# Patient Record
Sex: Male | Born: 1980 | Race: White | Hispanic: No | Marital: Married | Smoking: Current some day smoker
Health system: Southern US, Community
[De-identification: ages and names within clinical notes are randomized; demographics above are authoritative.]

## PROBLEM LIST (undated history)

## (undated) DIAGNOSIS — I639 Cerebral infarction, unspecified: Secondary | ICD-10-CM

## (undated) DIAGNOSIS — F329 Major depressive disorder, single episode, unspecified: Secondary | ICD-10-CM

## (undated) DIAGNOSIS — F419 Anxiety disorder, unspecified: Secondary | ICD-10-CM

## (undated) DIAGNOSIS — F32A Depression, unspecified: Secondary | ICD-10-CM

---

## 2010-10-22 ENCOUNTER — Emergency Department (HOSPITAL_COMMUNITY)
Admission: EM | Admit: 2010-10-22 | Discharge: 2010-10-22 | Disposition: A | Discharge status: Home / Self Care | Payer: 59 | Attending: Emergency Medicine | Admitting: Emergency Medicine

## 2010-10-22 DIAGNOSIS — F17200 Nicotine dependence, unspecified, uncomplicated: Secondary | ICD-10-CM | POA: Insufficient documentation

## 2010-10-22 DIAGNOSIS — S76219A Strain of adductor muscle, fascia and tendon of unspecified thigh, initial encounter: Secondary | ICD-10-CM

## 2010-10-22 DIAGNOSIS — X58XXXA Exposure to other specified factors, initial encounter: Secondary | ICD-10-CM | POA: Insufficient documentation

## 2010-10-22 DIAGNOSIS — IMO0002 Reserved for concepts with insufficient information to code with codable children: Secondary | ICD-10-CM | POA: Insufficient documentation

## 2010-10-22 DIAGNOSIS — M79609 Pain in unspecified limb: Secondary | ICD-10-CM | POA: Insufficient documentation

## 2010-10-22 HISTORY — DX: Major depressive disorder, single episode, unspecified: F32.9

## 2010-10-22 HISTORY — DX: Depression, unspecified: F32.A

## 2010-10-22 HISTORY — DX: Anxiety disorder, unspecified: F41.9

## 2010-10-22 MED ORDER — IBUPROFEN 800 MG PO TABS
800.0000 mg | ORAL_TABLET | Freq: Once | ORAL | Status: AC
  Administered 2010-10-22: 800 mg via ORAL
  Filled 2010-10-22: qty 1

## 2010-10-22 MED ORDER — METHOCARBAMOL 500 MG PO TABS: ORAL_TABLET | ORAL | Status: DC

## 2010-10-22 MED ORDER — IBUPROFEN 800 MG PO TABS: 800.0000 mg | ORAL_TABLET | Freq: Three times a day (TID) | ORAL | Status: AC

## 2010-10-22 MED ORDER — HYDROCODONE-ACETAMINOPHEN 5-325 MG PO TABS: ORAL_TABLET | ORAL | Status: DC

## 2010-10-22 MED ORDER — HYDROCODONE-ACETAMINOPHEN 5-325 MG PO TABS
2.0000 | ORAL_TABLET | Freq: Once | ORAL | Status: AC
  Administered 2010-10-22: 2 via ORAL
  Filled 2010-10-22: qty 2

## 2010-10-22 MED ORDER — METHOCARBAMOL 500 MG PO TABS
1000.0000 mg | ORAL_TABLET | Freq: Once | ORAL | Status: AC
  Administered 2010-10-22: 1000 mg via ORAL
  Filled 2010-10-22: qty 2

## 2010-10-22 NOTE — ED Notes (Signed)
Pt states upper lt leg hurts, denies injury trauma or a fall. Pt states has progressively gotten worse over past few  Days.

## 2010-10-22 NOTE — ED Provider Notes (Signed)
History     Chief Complaint  Patient presents with  . Leg Pain   Patient is a 30 y.o. male presenting with leg pain. The history is provided by the patient.  Leg Pain  The incident occurred yesterday. The incident occurred at work. The injury mechanism is unknown. Pain location: Left groin. The quality of the pain is described as aching. The pain is moderate. The pain has been worsening since onset. Associated symptoms include inability to bear weight. The symptoms are aggravated by bearing weight and palpation. He has tried nothing for the symptoms.    Past Medical History  Diagnosis Date  . Depression   . Anxiety     History reviewed. No pertinent past surgical history.  History reviewed. No pertinent family history.  History  Substance Use Topics  . Smoking status: Current Some Day Smoker  . Smokeless tobacco: Not on file  . Alcohol Use: Yes      Review of Systems  Constitutional: Negative for activity change.       All ROS Neg except as noted in HPI  HENT: Negative for nosebleeds and neck pain.   Eyes: Negative for photophobia and discharge.  Respiratory: Negative for cough, shortness of breath and wheezing.   Cardiovascular: Negative for chest pain and palpitations.  Gastrointestinal: Negative for abdominal pain and blood in stool.  Genitourinary: Negative for dysuria, frequency and hematuria.  Musculoskeletal: Negative for back pain and arthralgias.  Skin: Negative.   Neurological: Negative for dizziness, seizures and speech difficulty.  Psychiatric/Behavioral: Negative for hallucinations and confusion.    Physical Exam  BP 121/80  Pulse 79  Temp(Src) 97.4 F (36.3 C) (Oral)  Resp 16  Ht 5\' 11"  (1.803 m)  Wt 210 lb (95.255 kg)  BMI 29.29 kg/m2  SpO2 100%  Physical Exam  Nursing note and vitals reviewed. Constitutional: He is oriented to person, place, and time. He appears well-developed and well-nourished.  Non-toxic appearance.  HENT:  Head:  Normocephalic.  Right Ear: Tympanic membrane and external ear normal.  Left Ear: Tympanic membrane and external ear normal.  Eyes: EOM and lids are normal. Pupils are equal, round, and reactive to light.  Neck: Normal range of motion. Neck supple. Carotid bruit is not present.  Cardiovascular: Normal rate, regular rhythm, normal heart sounds, intact distal pulses and normal pulses.   Pulmonary/Chest: Breath sounds normal. No respiratory distress.  Abdominal: Soft. Bowel sounds are normal. There is no tenderness. There is no guarding.       No evidence of hernia.  Musculoskeletal: Normal range of motion.       Pain to the left inner thigh and groin with attempted ROM. No deformity or dislocation.  Lymphadenopathy:       Head (right side): No submandibular adenopathy present.       Head (left side): No submandibular adenopathy present.    He has no cervical adenopathy.  Neurological: He is alert and oriented to person, place, and time. He has normal strength. No cranial nerve deficit or sensory deficit.  Skin: Skin is warm and dry.  Psychiatric: He has a normal mood and affect. His speech is normal.    ED Course  Procedures  MDM I have reviewed nursing notes, vital signs, and all appropriate lab and imaging results for this patient.      Kathie Dike, [place] 10/22/10 [phone]

## 2010-10-22 NOTE — ED Notes (Signed)
Pt presents with left quad muscle pain. Pt states pain started yesterday after standing for 8 hours. Pt worked today and pain worsened to 10/10. Pt limping to triage. Pt denies groin pain.

## 2010-10-23 NOTE — ED Provider Notes (Signed)
Medical screening examination/treatment/procedure(s) were performed by non-physician practitioner and as supervising physician I was immediately available for consultation/collaboration.  Flint Melter, MD 10/23/10 [phone]

## 2018-09-13 ENCOUNTER — Emergency Department: Payer: Managed Care, Other (non HMO)

## 2018-09-13 ENCOUNTER — Inpatient Hospital Stay
Admission: EM | Admit: 2018-09-13 | Discharge: 2018-09-14 | DRG: 063 | Disposition: A | Discharge status: Home / Self Care | Payer: Managed Care, Other (non HMO) | Attending: Nurse Practitioner | Admitting: Nurse Practitioner

## 2018-09-13 ENCOUNTER — Inpatient Hospital Stay (HOSPITAL_COMMUNITY)
Admit: 2018-09-13 | Discharge: 2018-09-13 | Disposition: A | Discharge status: Home / Self Care | Payer: Managed Care, Other (non HMO) | Attending: Nurse Practitioner | Admitting: Nurse Practitioner

## 2018-09-13 ENCOUNTER — Inpatient Hospital Stay: Payer: Managed Care, Other (non HMO)

## 2018-09-13 ENCOUNTER — Other Ambulatory Visit: Payer: Self-pay

## 2018-09-13 ENCOUNTER — Inpatient Hospital Stay: Admit: 2018-09-13 | Payer: Managed Care, Other (non HMO)

## 2018-09-13 DIAGNOSIS — Z91018 Allergy to other foods: Secondary | ICD-10-CM | POA: Diagnosis not present

## 2018-09-13 DIAGNOSIS — Z91120 Patient's intentional underdosing of medication regimen due to financial hardship: Secondary | ICD-10-CM | POA: Diagnosis not present

## 2018-09-13 DIAGNOSIS — E785 Hyperlipidemia, unspecified: Secondary | ICD-10-CM | POA: Diagnosis present

## 2018-09-13 DIAGNOSIS — Y92009 Unspecified place in unspecified non-institutional (private) residence as the place of occurrence of the external cause: Secondary | ICD-10-CM

## 2018-09-13 DIAGNOSIS — F319 Bipolar disorder, unspecified: Secondary | ICD-10-CM | POA: Diagnosis present

## 2018-09-13 DIAGNOSIS — Z20828 Contact with and (suspected) exposure to other viral communicable diseases: Secondary | ICD-10-CM | POA: Diagnosis present

## 2018-09-13 DIAGNOSIS — Z9114 Patient's other noncompliance with medication regimen: Secondary | ICD-10-CM

## 2018-09-13 DIAGNOSIS — F17200 Nicotine dependence, unspecified, uncomplicated: Secondary | ICD-10-CM | POA: Diagnosis present

## 2018-09-13 DIAGNOSIS — I639 Cerebral infarction, unspecified: Secondary | ICD-10-CM | POA: Insufficient documentation

## 2018-09-13 DIAGNOSIS — T426X6A Underdosing of other antiepileptic and sedative-hypnotic drugs, initial encounter: Secondary | ICD-10-CM | POA: Diagnosis present

## 2018-09-13 DIAGNOSIS — G459 Transient cerebral ischemic attack, unspecified: Secondary | ICD-10-CM | POA: Diagnosis present

## 2018-09-13 DIAGNOSIS — F3170 Bipolar disorder, currently in remission, most recent episode unspecified: Secondary | ICD-10-CM | POA: Diagnosis not present

## 2018-09-13 LAB — URINALYSIS, ROUTINE W REFLEX MICROSCOPIC
Bacteria, UA: NONE SEEN
Bilirubin Urine: NEGATIVE
Glucose, UA: NEGATIVE mg/dL
Ketones, ur: NEGATIVE mg/dL
Leukocytes,Ua: NEGATIVE
Nitrite: NEGATIVE
Protein, ur: NEGATIVE mg/dL
Specific Gravity, Urine: >1.046 — ABNORMAL HIGH (ref 1.005–1.030)
Squamous Epithelial / LPF: NONE SEEN (ref 0–5)
WBC, UA: NONE SEEN WBC/hpf (ref 0–5)
pH: 6 (ref 5.0–8.0)

## 2018-09-13 LAB — CBC
HCT: 42.1 % (ref 39.0–52.0)
Hemoglobin: 14.1 g/dL (ref 13.0–17.0)
MCH: 30.3 pg (ref 26.0–34.0)
MCHC: 33.5 g/dL (ref 30.0–36.0)
MCV: 90.5 fL (ref 80.0–100.0)
Platelets: 297 10*3/uL (ref 150–400)
RBC: 4.65 MIL/uL (ref 4.22–5.81)
RDW: 13 % (ref 11.5–15.5)
WBC: 8.8 10*3/uL (ref 4.0–10.5)
nRBC: 0 % (ref 0.0–0.2)

## 2018-09-13 LAB — COMPREHENSIVE METABOLIC PANEL
ALT: 23 U/L (ref 0–44)
AST: 21 U/L (ref 15–41)
Albumin: 4 g/dL (ref 3.5–5.0)
Alkaline Phosphatase: 53 U/L (ref 38–126)
Anion gap: 10 (ref 5–15)
BUN: 12 mg/dL (ref 6–20)
CO2: 24 mmol/L (ref 22–32)
Calcium: 8.6 mg/dL — ABNORMAL LOW (ref 8.9–10.3)
Chloride: 101 mmol/L (ref 98–111)
Creatinine, Ser: 1.07 mg/dL (ref 0.61–1.24)
GFR calc Af Amer: >60 mL/min (ref >60)
GFR calc non Af Amer: >60 mL/min (ref >60)
Glucose, Bld: 119 mg/dL — ABNORMAL HIGH (ref 70–99)
Potassium: 3.6 mmol/L (ref 3.5–5.1)
Sodium: 135 mmol/L (ref 135–145)
Total Bilirubin: 0.4 mg/dL (ref 0.3–1.2)
Total Protein: 7.4 g/dL (ref 6.5–8.1)

## 2018-09-13 LAB — DIFFERENTIAL
Abs Immature Granulocytes: 0.04 10*3/uL (ref 0.00–0.07)
Basophils Absolute: 0 10*3/uL (ref 0.0–0.1)
Basophils Relative: 1 %
Eosinophils Absolute: 0.1 10*3/uL (ref 0.0–0.5)
Eosinophils Relative: 1 %
Immature Granulocytes: 1 %
Lymphocytes Relative: 34 %
Lymphs Abs: 3 10*3/uL (ref 0.7–4.0)
Monocytes Absolute: 0.8 10*3/uL (ref 0.1–1.0)
Monocytes Relative: 9 %
Neutro Abs: 4.8 10*3/uL (ref 1.7–7.7)
Neutrophils Relative %: 54 %

## 2018-09-13 LAB — URINE DRUG SCREEN, QUALITATIVE (ARMC ONLY)
Amphetamines, Ur Screen: NOT DETECTED
Barbiturates, Ur Screen: NOT DETECTED
Benzodiazepine, Ur Scrn: NOT DETECTED
Cannabinoid 50 Ng, Ur ~~LOC~~: NOT DETECTED
Cocaine Metabolite,Ur ~~LOC~~: NOT DETECTED
MDMA (Ecstasy)Ur Screen: NOT DETECTED
Methadone Scn, Ur: NOT DETECTED
Opiate, Ur Screen: NOT DETECTED
Phencyclidine (PCP) Ur S: NOT DETECTED
Tricyclic, Ur Screen: NOT DETECTED

## 2018-09-13 LAB — APTT: aPTT: 31 seconds (ref 24–36)

## 2018-09-13 LAB — LIPID PANEL
Cholesterol: 203 mg/dL — ABNORMAL HIGH (ref 0–200)
HDL: 30 mg/dL — ABNORMAL LOW (ref >40)
LDL Cholesterol: 128 mg/dL — ABNORMAL HIGH (ref 0–99)
Total CHOL/HDL Ratio: 6.8 RATIO
Triglycerides: 224 mg/dL — ABNORMAL HIGH (ref <150)
VLDL: 45 mg/dL — ABNORMAL HIGH (ref 0–40)

## 2018-09-13 LAB — SARS CORONAVIRUS 2 BY RT PCR (HOSPITAL ORDER, PERFORMED IN ~~LOC~~ HOSPITAL LAB): SARS Coronavirus 2: NEGATIVE

## 2018-09-13 LAB — MRSA PCR SCREENING: MRSA by PCR: NEGATIVE

## 2018-09-13 LAB — PROTIME-INR
INR: 1 (ref 0.8–1.2)
Prothrombin Time: 13.1 seconds (ref 11.4–15.2)

## 2018-09-13 LAB — GLUCOSE, CAPILLARY: Glucose-Capillary: 111 mg/dL — ABNORMAL HIGH (ref 70–99)

## 2018-09-13 LAB — ETHANOL: Alcohol, Ethyl (B): <10 mg/dL (ref <10)

## 2018-09-13 LAB — PROCALCITONIN: Procalcitonin: <0.1 ng/mL

## 2018-09-13 MED ORDER — ALTEPLASE (STROKE) FULL DOSE INFUSION
9.0000 mg | Freq: Once | INTRAVENOUS | Status: AC
  Administered 2018-09-13: 9 mg via INTRAVENOUS
  Filled 2018-09-13: qty 100

## 2018-09-13 MED ORDER — ACETAMINOPHEN 325 MG PO TABS: 650.0000 mg | ORAL_TABLET | ORAL | Status: DC | PRN

## 2018-09-13 MED ORDER — SENNOSIDES-DOCUSATE SODIUM 8.6-50 MG PO TABS
1.0000 | ORAL_TABLET | Freq: Every evening | ORAL | Status: DC | PRN
  Filled 2018-09-13: qty 1

## 2018-09-13 MED ORDER — SODIUM CHLORIDE 0.9 % IV SOLN
INTRAVENOUS | Status: DC
  Administered 2018-09-13: 75 mL/h via INTRAVENOUS

## 2018-09-13 MED ORDER — ACETAMINOPHEN 650 MG RE SUPP
650.0000 mg | RECTAL | Status: DC | PRN
  Filled 2018-09-13: qty 1

## 2018-09-13 MED ORDER — SODIUM CHLORIDE 0.9 % IV SOLN
50.0000 mL | Freq: Once | INTRAVENOUS | Status: AC
  Administered 2018-09-13: 50 mL via INTRAVENOUS

## 2018-09-13 MED ORDER — ALTEPLASE (STROKE) FULL DOSE INFUSION
81.0000 mg | Freq: Once | INTRAVENOUS | Status: AC
  Administered 2018-09-13: 81 mg via INTRAVENOUS
  Filled 2018-09-13: qty 100

## 2018-09-13 MED ORDER — STROKE: EARLY STAGES OF RECOVERY BOOK: Freq: Once | Status: DC

## 2018-09-13 MED ORDER — ACETAMINOPHEN 160 MG/5ML PO SOLN
650.0000 mg | ORAL | Status: DC | PRN
  Filled 2018-09-13: qty 20.3

## 2018-09-13 MED ORDER — IOPAMIDOL (ISOVUE-370) INJECTION 76%
100.0000 mL | Freq: Once | INTRAVENOUS | Status: AC | PRN
  Administered 2018-09-13: 100 mL via INTRAVENOUS

## 2018-09-13 MED ORDER — ALTEPLASE 100 MG IV SOLR
INTRAVENOUS | Status: AC
  Filled 2018-09-13: qty 100

## 2018-09-13 NOTE — ED Triage Notes (Signed)
Patient reports numbness to left side of face and arm for approximately 1 hour.

## 2018-09-13 NOTE — ED Provider Notes (Signed)
[facility] Emergency Department Provider Note  ____________________________________________   First MD Initiated Contact with Patient 09/13/18 [phone]     (approximate)  I have reviewed the triage vital signs and the nursing notes.   HISTORY  Chief Complaint Numbness    HPI Anthony Crawford is a 38 y.o. male density emergency department with a 1 hour history of left side facial numbness and left arm weakness.  Patient states that he was awake at the time of onset patient denies any visual difficulty.  Patient denies any lower extremity involvement.  Patient denies any nausea or vomiting.  Patient denies any headache.  Patient also admits to 1 day history of cough and dyspnea.        Past Medical History:  Diagnosis Date   Anxiety    Depression     There are no active problems to display for this patient.   No past surgical history on file.  Prior to Admission medications   Medication Sig Start Date End Date Taking? Authorizing Provider  HYDROcodone-acetaminophen (NORCO) 5-325 MG per tablet 1-2 po q4h prn pain. Patient not taking: Reported on 09/13/2018 10/22/10   Ivery QualeBryant, Hobson, PA-C  ibuprofen (ADVIL,MOTRIN) 200 MG tablet Take 200 mg by mouth every 6 (six) hours as needed. Pain     [provider]  methocarbamol (ROBAXIN) 500 MG tablet 2 po tid for spasm Patient not taking: Reported on 09/13/2018 10/22/10   Ivery QualeBryant, Hobson, PA-C    Allergies Other, Strawberry extract, and Tequila sunrise flavor  No family history on file.  Social History Social History   Tobacco Use   Smoking status: Current Some Day Smoker  Substance Use Topics   Alcohol use: Yes   Drug use: No    Review of Systems Constitutional: No fever/chills Eyes: No visual changes. ENT: No sore throat. Cardiovascular: Denies chest pain. Respiratory: Denies shortness of breath. Gastrointestinal: No abdominal pain.  No nausea, no vomiting.  No diarrhea.  No  constipation. Genitourinary: Negative for dysuria. Musculoskeletal: Negative for neck pain.  Negative for back pain. Integumentary: Negative for rash. Neurological:positive for left facial numbness and left arm weakness  ____________________________________________   PHYSICAL EXAM:  VITAL SIGNS: ED Triage Vitals [09/13/18 0439]  Enc Vitals Group     BP      Pulse      Resp      Temp      Temp src      SpO2      Weight 102.1 kg (225 lb)     Height 1.803 m (5\' 11" )     Head Circumference      Peak Flow      Pain Score 6     Pain Loc      Pain Edu?      Excl. in GC?     Constitutional: Alert and oriented. Well appearing and in no acute distress. Eyes: Conjunctivae are normal. PERRL. EOMI. Mouth/Throat: Mucous membranes are moist.  Oropharynx non-erythematous. Neck: No stridor.  Cardiovascular: Normal rate, regular rhythm. Good peripheral circulation. Grossly normal heart sounds. Respiratory: Normal respiratory effort.  No retractions. No audible wheezing. Gastrointestinal: Soft and nontender. No distention.  Musculoskeletal: No lower extremity tenderness nor edema. No gross deformities of extremities. Neurologic:  Normal speech and language.  Left arm drift (however not down to the bed ).  Left lower extremity drift (does not hit the bed). After CT scan patient unable to lift left leg off the bed patient's left arm also  difficult to raise off the bed at this time.  skin:  Skin is warm, dry and intact. No rash noted. Psychiatric: Mood and affect are normal. Speech and behavior are normal.  ____________________________________________   LABS (all labs ordered are listed, but only abnormal results are displayed)  Labs Reviewed  COMPREHENSIVE METABOLIC PANEL - Abnormal; Notable for the following components:      Result Value   Glucose, Bld 119 (*)    Calcium 8.6 (*)    All other components within normal limits  GLUCOSE, CAPILLARY - Abnormal; Notable for the following  components:   Glucose-Capillary 111 (*)    All other components within normal limits  SARS CORONAVIRUS 2 (HOSPITAL ORDER, PERFORMED IN [facility])  PROTIME-INR  APTT  CBC  DIFFERENTIAL  ETHANOL  URINALYSIS, ROUTINE W REFLEX MICROSCOPIC  URINE DRUG SCREEN, QUALITATIVE ([facility] ONLY)  CBG MONITORING, ED  I-STAT CREATININE, ED  I-STAT CREATININE, ED   ___________________________________________  RADIOLOGY I, Elber[place] N Tenleigh Byer, personally viewed and evaluated these images (plain radiographs) as part of my medical decision making, as well as reviewing the written report by the radiologist.  ED MD interpretation: Negative noncontrast CT scan of the head.  CT angiogram of the head negative as well as CT of the neck  Official radiology report(s): Ct Code Stroke Cta Head W/wo Contrast  Result Date: 09/13/2018 CLINICAL DATA:  38 y/o  M; progressive left-sided weakness. EXAM: CT ANGIOGRAPHY HEAD AND NECK TECHNIQUE: Multidetector CT imaging of the head and neck was performed using the standard protocol during bolus administration of intravenous contrast. Multiplanar CT image reconstructions and MIPs were obtained to evaluate the vascular anatomy. Carotid stenosis measurements (when applicable) are obtained utilizing NASCET criteria, using the distal internal carotid diameter as the denominator. CONTRAST:  100mL ISOVUE-370 IOPAMIDOL (ISOVUE-370) INJECTION 76% COMPARISON:  None. FINDINGS: CT HEAD FINDINGS Brain: No evidence of acute infarction, hemorrhage, hydrocephalus, extra-axial collection or mass lesion/mass effect. ASPECTS is 10. Vascular: As below. Skull: Normal. Negative for fracture or focal lesion. Sinuses: Imaged portions are clear. Orbits: No acute finding. Review of the MIP images confirms the above findings CTA NECK FINDINGS Aortic arch: Standard branching. Imaged portion shows no evidence of aneurysm or dissection. No significant stenosis of the major arch vessel origins. Right  carotid system: No evidence of dissection, stenosis (50% or greater) or occlusion. Left carotid system: No evidence of dissection, stenosis (50% or greater) or occlusion. Vertebral arteries: Codominant. No evidence of dissection, stenosis (50% or greater) or occlusion. Skeleton: Negative. Other neck: Negative. Upper chest: Negative. Review of the MIP images confirms the above findings CTA HEAD FINDINGS Anterior circulation: No significant stenosis, proximal occlusion, aneurysm, or vascular malformation. Posterior circulation: No significant stenosis, proximal occlusion, aneurysm, or vascular malformation. Venous sinuses: As permitted by contrast timing, patent. Anatomic variants: None significant. Review of the MIP images confirms the above findings IMPRESSION: 1. Negative noncontrast CT of the head.  ASPECTS is 10. 2. Patent carotid and vertebral arteries. No dissection, aneurysm, or hemodynamically significant stenosis utilizing NASCET criteria. 3. Patent anterior and posterior intracranial circulation. No large vessel occlusion, aneurysm, or significant stenosis. Noncontrast CT head results were called by telephone at the time of interpretation on 09/13/2018 at 5:24 am to tele neurology nurse who verbally acknowledged these results. CT angiogram results were called by telephone at the time of interpretation on 09/13/2018 at 5:29 am to tele neurology nurse, who verbally acknowledged these results. Electronically Signed   By: Buzzy HanLance  Furusawa-Stratton M.D.  On: 09/13/2018 05:32   Ct Code Stroke Cta Neck W/wo Contrast  Result Date: 09/13/2018 CLINICAL DATA:  38 y/o  M; progressive left-sided weakness. EXAM: CT ANGIOGRAPHY HEAD AND NECK TECHNIQUE: Multidetector CT imaging of the head and neck was performed using the standard protocol during bolus administration of intravenous contrast. Multiplanar CT image reconstructions and MIPs were obtained to evaluate the vascular anatomy. Carotid stenosis measurements (when  applicable) are obtained utilizing NASCET criteria, using the distal internal carotid diameter as the denominator. CONTRAST:  100mL ISOVUE-370 IOPAMIDOL (ISOVUE-370) INJECTION 76% COMPARISON:  None. FINDINGS: CT HEAD FINDINGS Brain: No evidence of acute infarction, hemorrhage, hydrocephalus, extra-axial collection or mass lesion/mass effect. ASPECTS is 10. Vascular: As below. Skull: Normal. Negative for fracture or focal lesion. Sinuses: Imaged portions are clear. Orbits: No acute finding. Review of the MIP images confirms the above findings CTA NECK FINDINGS Aortic arch: Standard branching. Imaged portion shows no evidence of aneurysm or dissection. No significant stenosis of the major arch vessel origins. Right carotid system: No evidence of dissection, stenosis (50% or greater) or occlusion. Left carotid system: No evidence of dissection, stenosis (50% or greater) or occlusion. Vertebral arteries: Codominant. No evidence of dissection, stenosis (50% or greater) or occlusion. Skeleton: Negative. Other neck: Negative. Upper chest: Negative. Review of the MIP images confirms the above findings CTA HEAD FINDINGS Anterior circulation: No significant stenosis, proximal occlusion, aneurysm, or vascular malformation. Posterior circulation: No significant stenosis, proximal occlusion, aneurysm, or vascular malformation. Venous sinuses: As permitted by contrast timing, patent. Anatomic variants: None significant. Review of the MIP images confirms the above findings IMPRESSION: 1. Negative noncontrast CT of the head.  ASPECTS is 10. 2. Patent carotid and vertebral arteries. No dissection, aneurysm, or hemodynamically significant stenosis utilizing NASCET criteria. 3. Patent anterior and posterior intracranial circulation. No large vessel occlusion, aneurysm, or significant stenosis. Noncontrast CT head results were called by telephone at the time of interpretation on 09/13/2018 at 5:24 am to tele neurology nurse who verbally  acknowledged these results. CT angiogram results were called by telephone at the time of interpretation on 09/13/2018 at 5:29 am to tele neurology nurse, who verbally acknowledged these results. Electronically Signed   By: Mitzi HansenLance  Furusawa-Stratton M.D.   On: 09/13/2018 05:32    ____________________________________________   PROCEDURES   Procedure(s) performed (including Critical Care):  .Critical Care Performed by: Darci CurrentBrown, [place] N, MD Authorized by: Darci CurrentBrown, Heard N, MD   Critical care provider statement:    Critical care time (minutes):  60   Critical care time was exclusive of:  Separately billable procedures and treating other patients (Cerebrovascular accident)   Critical care was time spent personally by me on the following activities:  Development of treatment plan with patient or surrogate, discussions with consultants, evaluation of patient's response to treatment, examination of patient, obtaining history from patient or surrogate, ordering and performing treatments and interventions, ordering and review of laboratory studies, ordering and review of radiographic studies, pulse oximetry, re-evaluation of patient's condition and review of old charts     ____________________________________________   INITIAL IMPRESSION / MDM / ASSESSMENT AND PLAN / ED COURSE  As part of my medical decision making, I reviewed the following data within the electronic MEDICAL RECORD NUMBER  38 year old male presented with above-stated history and physical exam concerning for CVA and as such code stroke was initiated.  Patient went for CT scan of the head immediately after my evaluation.  Patient discussed with Dr. Glenetta HewMier neurologist who recommends TPA at this time  which he ordered.  6:45 AM On reevaluation patient symptoms completely resolved patient able to raise both his left arm and leg off the bed and hold for greater than 10 seconds without any drift..   Patient's COVID-19 testing negative.   Patient discussed with Dr. Posey Pronto hospitalist for hospital admission for further evaluation and management of CVA.  *Daris Harkins was evaluated in Emergency Department on 09/13/2018 for the symptoms described in the history of present illness. He was evaluated in the context of the global COVID-19 pandemic, which necessitated consideration that the patient might be at risk for infection with the SARS-CoV-2 virus that causes COVID-19. Institutional protocols and algorithms that pertain to the evaluation of patients at risk for COVID-19 are in a state of rapid change based on information released by regulatory bodies including the CDC and federal and state organizations. These policies and algorithms were followed during the patient's care in the ED.  Some ED evaluations and interventions may be delayed as a result of limited staffing during the pandemic.*   ____________________________________________  FINAL CLINICAL IMPRESSION(S) / ED DIAGNOSES  Final diagnoses:  Cerebrovascular accident (CVA), unspecified mechanism ([place])     MEDICATIONS GIVEN DURING THIS VISIT:  Medications  iopamidol (ISOVUE-370) 76 % injection 100 mL (100 mLs Intravenous Contrast Given 09/13/18 0517)  alteplase (ACTIVASE) 1 mg/mL infusion 9 mg (0 mg Intravenous Stopped 09/13/18 0529)  alteplase (ACTIVASE) 1 mg/mL infusion 81 mg (0 mg Intravenous Stopped 09/13/18 0630)    Followed by  0.9 %  sodium chloride infusion (0 mLs Intravenous Stopped 09/13/18 7209)     ED Discharge Orders    None       Note:  This document was prepared using Dragon voice recognition software and may include unintentional dictation errors.   Gregor Hams, MD 09/13/18 [phone]

## 2018-09-13 NOTE — Progress Notes (Signed)
OT Cancellation Note  Patient Details Name: Anthony Crawford MRN: [id number] DOB: [birth year]   Cancelled Treatment:    Reason Eval/Treat Not Completed: Patient not medically ready OT order received, pt chart reviewed. Per chart review tPA administered. Per OT guidelines, hold evaluation until 24hrs post tPA administration and follow up imaging performed.  Zenovia Jarred, [place], OTR/L [place]  Zenovia Jarred 09/13/2018, 3:30 PM

## 2018-09-13 NOTE — Consult Note (Signed)
19 M smoker with PMH of anxiety and depression only adm to ICU/SDU after tPA for suspected acute CVA.  PCCM service is available as needed for any issues that arise while in ICU/SDU  Merton Border, MD PCCM service Mobile [phone] Pager [phone] 09/13/2018 2:54 PM

## 2018-09-13 NOTE — ED Notes (Signed)
Pt arrived in room from ct.

## 2018-09-13 NOTE — ED Notes (Signed)
Patient eating food brought by wife.

## 2018-09-13 NOTE — ED Notes (Signed)
Report to amber, rn.  

## 2018-09-13 NOTE — ED Notes (Signed)
Dr. Mike Craze with teleneurology on screen at this time. Pt is currently in ct.

## 2018-09-13 NOTE — ED Notes (Signed)
Patient back from MRI.

## 2018-09-13 NOTE — Consult Note (Signed)
Chief Complaint: left sided numbness (face arm and leg)   LSN: 4:00 am tPA Given: Yes This iS 38 y.o. male with pertinent past medical history of hyperlipidemia and bipolar disorder on lamotrigine ( has not been taking it, he can;t afford psych/meds) temporal arteritis  presenting to the ED with chief complaints of left-sided numbness and weakness.   onset of symptoms on 09/13/2018 at about 03:30 - 04:00 in am with  with worsening left facial and left arm left leg numbness and weakness while taking a shower.  Denies associated altered sensorium, speech abnormality, cranial nerve deficit, seizures, diplopia, nausea or vomiting, headachesyncope or LOC, paresthesia (numbness, tingling, pins-and-needles sensation) or a heavy feeling in an extremity.  Patient states he has not been feeling well since Monday, was evaluated at urgent care for shortness of breath, cough, fever and generalized weakness prior to onset of symptoms.  Due to worsening symptoms patient decided to present to the ED for further evaluation of a possible stroke. On arrival to the ED, he was afebrile with blood pressure 127/81 mm Hg and pulse rate 84 beats/min.  He was noted to have left facial droop, left facial and arm numbness and weakness.  Initial NIH stroke scale was 5. Code stroke was initiated and patient was evaluated by a tele-neurologist.  CTA head and neck was obtained which showed no large vessel occlusion or acute intracranial abnormality.  Patient was deemed a candidate for IV alteplase. Patient tolerated tPA without complication.  On exam, there were no focal neurologic deficit, patient reported resolution of symptoms with mild residual weakness in his left lower leg. Patient reported he was not on any antiplatelet or anticoagulation prior to this episode.  Initial labs in the ED including COVID-19 unremarkable.  Chest x-ray shows no active cardiopulmonary process.  Patient is being admitted under hospitalist service in  the ICU for further  Patient have been having HA since Monday that he describes being dull that are getting better. Patient has history of migraine in his 20's that subsided around his mid 79's. Holocephalic, not specific trigger, lasting for 2-3 days, relieved bi OTC meds with photophobia, phonophobia. Last One was one year and half ago    Past Medical History:  Diagnosis Date  . Anxiety   . Depression     No past surgical history on file.  Family History  Problem Relation Age of Onset  . Lupus Mother   . Diabetes Father    Social History:  reports that he has been smoking. He does not have any smokeless tobacco history on file. He reports current alcohol use. He reports that he does not use drugs.  Allergies:  Allergies  Allergen Reactions  . Other Other (See Comments)    Cactus, Patient states that he will die if he touches it.   . Strawberry Extract Itching  . [employer] Other (See Comments)    Patient states that he will die.    (Not in a hospital admission)   ROS: Per hpi  Physical Examination: Blood pressure 115/76, pulse 73, temperature 98.5 F (36.9 C), temperature source Oral, resp. rate 19, height  (1.803 m), weight 102.1 kg, SpO2 94 %.  HEENT-  Normocephalic, no lesions, without obvious abnormality.  Normal external eye and conjunctiva.  Normal TM's bilaterally.  Normal auditory canals and external ears. Normal external nose, mucus membranes and septum.  Normal pharynx. Neck supple with no masses, nodes, nodules or enlargement. Cardiovascular - regular rate and  rhythm, S1, S2 normal, no murmur, click, rub or gallop Lungs - chest clear, no wheezing, rales, normal symmetric air entry, Heart exam - S1, S2 normal, no murmur, no gallop, rate regular Abdomen - soft, non-tender; bowel sounds normal; no masses,  no organomegaly Extremities - no edema  Neurologic Examination: Patient alert, awake, oriented x4, speech is nle, following  commands CN: PERLLA, EOMI, VFF, no nystagmus, questionable flattening od left naso labial fold, face sensation seems to be nle, palate and tongue midline Motor" 5/5 in all extremities but with drift in LUEX and LLEX No sensory deficit appreciated No coordination deficit appreciated DTR and Gait not checked at this time  Results for orders placed or performed during the hospital encounter of 09/13/18 (from the past 48 hour(s))  Protime-INR     Status: None   Collection Time: 09/13/18  4:49 AM  Result Value Ref Range   Prothrombin Time 13.1 11.4 - 15.2 seconds   INR 1.0 0.8 - 1.2    Comment: (NOTE) INR goal varies based on device and disease states. Performed at [facility], [address] [place], [place] [address]   APTT     Status: None   Collection Time: 09/13/18  4:49 AM  Result Value Ref Range   aPTT 31 24 - 36 seconds    Comment: Performed at [facility], [address] [place], [place] [address]  CBC     Status: None   Collection Time: 09/13/18  4:49 AM  Result Value Ref Range   WBC 8.8 4.0 - 10.5 K/uL   RBC 4.65 4.22 - 5.81 MIL/uL   Hemoglobin 14.1 13.0 - 17.0 g/dL   HCT 82.942.1 56.239.0 - 13.052.0 %   MCV 90.5 80.0 - 100.0 fL   MCH 30.3 26.0 - 34.0 pg   MCHC 33.5 30.0 - 36.0 g/dL   RDW 86.513.0 78.411.5 - 69.615.5 %   Platelets 297 150 - 400 K/uL   nRBC 0.0 0.0 - 0.2 %    Comment: Performed at [facility], [address] [place], [place] [address]  Differential     Status: None   Collection Time: 09/13/18  4:49 AM  Result Value Ref Range   Neutrophils Relative % 54 %   Neutro Abs 4.8 1.7 - 7.7 K/uL   Lymphocytes Relative 34 %   Lymphs Abs 3.0 0.7 - 4.0 K/uL   Monocytes Relative 9 %   Monocytes Absolute 0.8 0.1 - 1.0 K/uL   Eosinophils Relative 1 %   Eosinophils Absolute 0.1 0.0 - 0.5 K/uL   Basophils Relative 1 %   Basophils Absolute 0.0 0.0 - 0.1 K/uL   Immature Granulocytes 1 %   Abs Immature Granulocytes 0.04 0.00 - 0.07 K/uL    Comment:  Performed at [facility], [address] [place], [place] [address]  Comprehensive metabolic panel     Status: Abnormal   Collection Time: 09/13/18  4:49 AM  Result Value Ref Range   Sodium 135 135 - 145 mmol/L   Potassium 3.6 3.5 - 5.1 mmol/L   Chloride 101 98 - 111 mmol/L   CO2 24 22 - 32 mmol/L   Glucose, Bld 119 (H) 70 - 99 mg/dL   BUN 12 6 - 20 mg/dL   Creatinine, Ser 4.011.07 0.61 - 1.24 mg/dL   Calcium 8.6 (L) 8.9 - 10.3 mg/dL   Total Protein 7.4 6.5 - 8.1 g/dL   Albumin 4.0 3.5 - 5.0 g/dL   AST 21  15 - 41 U/L   ALT 23 0 - 44 U/L   Alkaline Phosphatase 53 38 - 126 U/L   Total Bilirubin 0.4 0.3 - 1.2 mg/dL   GFR calc non Af Amer >60 >60 mL/min   GFR calc Af Amer >60 >60 mL/min   Anion gap 10 5 - 15    Comment: Performed at [facility], [address] [place], [place] [address]  SARS Coronavirus 2 (CEPHEID- Performed in [facility] hospital lab), Hosp Order     Status: None   Collection Time: 09/13/18  4:49 AM   Specimen: Nasopharyngeal Swab  Result Value Ref Range   SARS Coronavirus 2 NEGATIVE NEGATIVE    Comment: (NOTE) If result is NEGATIVE SARS-CoV-2 target nucleic acids are NOT DETECTED. The SARS-CoV-2 RNA is generally detectable in upper and lower  respiratory specimens during the acute phase of infection. The lowest  concentration of SARS-CoV-2 viral copies this assay can detect is 250  copies / mL. A negative result does not preclude SARS-CoV-2 infection  and should not be used as the sole basis for treatment or other  patient management decisions.  A negative result may occur with  improper specimen collection / handling, submission of specimen other  than nasopharyngeal swab, presence of viral mutation(s) within the  areas targeted by this assay, and inadequate number of viral copies  (<250 copies / mL). A negative result must be combined with clinical  observations, patient history, and epidemiological information. If result is  POSITIVE SARS-CoV-2 target nucleic acids are DETECTED. The SARS-CoV-2 RNA is generally detectable in upper and lower  respiratory specimens dur ing the acute phase of infection.  Positive  results are indicative of active infection with SARS-CoV-2.  Clinical  correlation with patient history and other diagnostic information is  necessary to determine patient infection status.  Positive results do  not rule out bacterial infection or co-infection with other viruses. If result is PRESUMPTIVE POSTIVE SARS-CoV-2 nucleic acids MAY BE PRESENT.   A presumptive positive result was obtained on the submitted specimen  and confirmed on repeat testing.  While 2019 novel coronavirus  (SARS-CoV-2) nucleic acids may be present in the submitted sample  additional confirmatory testing may be necessary for epidemiological  and / or clinical management purposes  to differentiate between  SARS-CoV-2 and other Sarbecovirus currently known to infect humans.  If clinically indicated additional testing with an alternate test  methodology [phone]) is advised. The SARS-CoV-2 RNA is generally  detectable in upper and lower respiratory sp ecimens during the acute  phase of infection. The expected result is Negative. Fact Sheet for Patients:  BoilerBrush.com.cy Fact Sheet for Healthcare Providers: https://pope.com/ This test is not yet approved or cleared by the [place] FDA and has been authorized for detection and/or diagnosis of SARS-CoV-2 by FDA under an Emergency Use Authorization (EUA).  This EUA will remain in effect (meaning this test can be used) for the duration of the COVID-19 declaration under Section 564(b)(1) of the Act, 21 U.S.C. section 360bbb-3(b)(1), unless the authorization is terminated or revoked sooner. Performed at [facility], [address] [place], [place] [address]   Ethanol     Status: None   Collection Time:  09/13/18  4:49 AM  Result Value Ref Range   Alcohol, Ethyl (B) <10 <10 mg/dL    Comment: (NOTE) Lowest detectable limit for serum alcohol is 10 mg/dL. For medical purposes only. Performed at [facility], [address], [place], [place]  [id number]   Glucose, capillary     Status: Abnormal   Collection Time: 09/13/18  5:23 AM  Result Value Ref Range   Glucose-Capillary 111 (H) 70 - 99 mg/dL  Urinalysis, Routine w reflex microscopic     Status: Abnormal   Collection Time: 09/13/18  7:52 AM  Result Value Ref Range   Color, Urine STRAW (A) YELLOW   APPearance CLEAR (A) CLEAR   Specific Gravity, Urine >1.046 (H) 1.005 - 1.030   pH 6.0 5.0 - 8.0   Glucose, UA NEGATIVE NEGATIVE mg/dL   Hgb urine dipstick SMALL (A) NEGATIVE   Bilirubin Urine NEGATIVE NEGATIVE   Ketones, ur NEGATIVE NEGATIVE mg/dL   Protein, ur NEGATIVE NEGATIVE mg/dL   Nitrite NEGATIVE NEGATIVE   Leukocytes,Ua NEGATIVE NEGATIVE   RBC / HPF 0-5 0 - 5 RBC/hpf   WBC, UA NONE SEEN 0 - 5 WBC/hpf   Bacteria, UA NONE SEEN NONE SEEN   Squamous Epithelial / LPF NONE SEEN 0 - 5    Comment: Performed at [facility], [address] [place], [place] [address]  Urine Drug Screen, Qualitative ([facility] only)     Status: None   Collection Time: 09/13/18  7:52 AM  Result Value Ref Range   Tricyclic, Ur Screen NONE DETECTED NONE DETECTED   Amphetamines, Ur Screen NONE DETECTED NONE DETECTED   MDMA (Ecstasy)Ur Screen NONE DETECTED NONE DETECTED   Cocaine Metabolite,Ur [place] NONE DETECTED NONE DETECTED   Opiate, Ur Screen NONE DETECTED NONE DETECTED   Phencyclidine (PCP) Ur S NONE DETECTED NONE DETECTED   Cannabinoid 50 Ng, Ur [place] NONE DETECTED NONE DETECTED   Barbiturates, Ur Screen NONE DETECTED NONE DETECTED   Benzodiazepine, Ur Scrn NONE DETECTED NONE DETECTED   Methadone Scn, Ur NONE DETECTED NONE DETECTED    Comment: (NOTE) Tricyclics + metabolites, urine    Cutoff 1000 ng/mL Amphetamines + metabolites, urine   Cutoff 1000 ng/mL MDMA (Ecstasy), urine              Cutoff 500 ng/mL Cocaine Metabolite, urine          Cutoff 300 ng/mL Opiate + metabolites, urine        Cutoff 300 ng/mL Phencyclidine (PCP), urine         Cutoff 25 ng/mL Cannabinoid, urine                 Cutoff 50 ng/mL Barbiturates + metabolites, urine  Cutoff 200 ng/mL Benzodiazepine, urine              Cutoff 200 ng/mL Methadone, urine                   Cutoff 300 ng/mL The urine drug screen provides only a preliminary, unconfirmed analytical test result and should not be used for non-medical purposes. Clinical consideration and professional judgment should be applied to any positive drug screen result due to possible interfering substances. A more specific alternate chemical method must be used in order to obtain a confirmed analytical result. Gas chromatography / mass spectrometry (GC/MS) is the preferred confirmat ory method. Performed at [facility], [address] [place], [place] [address]    Ct Code Stroke Cta Head W/wo Contrast  Result Date: 09/13/2018 CLINICAL DATA:  38 y/o  M; progressive left-sided weakness. EXAM: CT ANGIOGRAPHY HEAD AND NECK TECHNIQUE: Multidetector CT imaging of the head and neck was performed using the standard protocol during bolus administration of intravenous contrast. Multiplanar CT image reconstructions and MIPs  were obtained to evaluate the vascular anatomy. Carotid stenosis measurements (when applicable) are obtained utilizing NASCET criteria, using the distal internal carotid diameter as the denominator. CONTRAST:  141mL ISOVUE-370 IOPAMIDOL (ISOVUE-370) INJECTION 76% COMPARISON:  None. FINDINGS: CT HEAD FINDINGS Brain: No evidence of acute infarction, hemorrhage, hydrocephalus, extra-axial collection or mass lesion/mass effect. ASPECTS is 10. Vascular: As below. Skull: Normal. Negative for fracture or focal lesion. Sinuses: Imaged portions are clear. Orbits: No acute finding. Review  of the MIP images confirms the above findings CTA NECK FINDINGS Aortic arch: Standard branching. Imaged portion shows no evidence of aneurysm or dissection. No significant stenosis of the major arch vessel origins. Right carotid system: No evidence of dissection, stenosis (50% or greater) or occlusion. Left carotid system: No evidence of dissection, stenosis (50% or greater) or occlusion. Vertebral arteries: Codominant. No evidence of dissection, stenosis (50% or greater) or occlusion. Skeleton: Negative. Other neck: Negative. Upper chest: Negative. Review of the MIP images confirms the above findings CTA HEAD FINDINGS Anterior circulation: No significant stenosis, proximal occlusion, aneurysm, or vascular malformation. Posterior circulation: No significant stenosis, proximal occlusion, aneurysm, or vascular malformation. Venous sinuses: As permitted by contrast timing, patent. Anatomic variants: None significant. Review of the MIP images confirms the above findings IMPRESSION: 1. Negative noncontrast CT of the head.  ASPECTS is 10. 2. Patent carotid and vertebral arteries. No dissection, aneurysm, or hemodynamically significant stenosis utilizing NASCET criteria. 3. Patent anterior and posterior intracranial circulation. No large vessel occlusion, aneurysm, or significant stenosis. Noncontrast CT head results were called by telephone at the time of interpretation on 09/13/2018 at 5:24 am to tele neurology nurse who verbally acknowledged these results. CT angiogram results were called by telephone at the time of interpretation on 09/13/2018 at 5:29 am to tele neurology nurse, who verbally acknowledged these results. Electronically Signed   By: Kristine Garbe M.D.   On: 09/13/2018 05:32   Ct Code Stroke Cta Neck W/wo Contrast  Result Date: 09/13/2018 CLINICAL DATA:  38 y/o  M; progressive left-sided weakness. EXAM: CT ANGIOGRAPHY HEAD AND NECK TECHNIQUE: Multidetector CT imaging of the head and neck was  performed using the standard protocol during bolus administration of intravenous contrast. Multiplanar CT image reconstructions and MIPs were obtained to evaluate the vascular anatomy. Carotid stenosis measurements (when applicable) are obtained utilizing NASCET criteria, using the distal internal carotid diameter as the denominator. CONTRAST:  152mL ISOVUE-370 IOPAMIDOL (ISOVUE-370) INJECTION 76% COMPARISON:  None. FINDINGS: CT HEAD FINDINGS Brain: No evidence of acute infarction, hemorrhage, hydrocephalus, extra-axial collection or mass lesion/mass effect. ASPECTS is 10. Vascular: As below. Skull: Normal. Negative for fracture or focal lesion. Sinuses: Imaged portions are clear. Orbits: No acute finding. Review of the MIP images confirms the above findings CTA NECK FINDINGS Aortic arch: Standard branching. Imaged portion shows no evidence of aneurysm or dissection. No significant stenosis of the major arch vessel origins. Right carotid system: No evidence of dissection, stenosis (50% or greater) or occlusion. Left carotid system: No evidence of dissection, stenosis (50% or greater) or occlusion. Vertebral arteries: Codominant. No evidence of dissection, stenosis (50% or greater) or occlusion. Skeleton: Negative. Other neck: Negative. Upper chest: Negative. Review of the MIP images confirms the above findings CTA HEAD FINDINGS Anterior circulation: No significant stenosis, proximal occlusion, aneurysm, or vascular malformation. Posterior circulation: No significant stenosis, proximal occlusion, aneurysm, or vascular malformation. Venous sinuses: As permitted by contrast timing, patent. Anatomic variants: None significant. Review of the MIP images confirms the above findings IMPRESSION: 1. Negative noncontrast  CT of the head.  ASPECTS is 10. 2. Patent carotid and vertebral arteries. No dissection, aneurysm, or hemodynamically significant stenosis utilizing NASCET criteria. 3. Patent anterior and posterior  intracranial circulation. No large vessel occlusion, aneurysm, or significant stenosis. Noncontrast CT head results were called by telephone at the time of interpretation on 09/13/2018 at 5:24 am to tele neurology nurse who verbally acknowledged these results. CT angiogram results were called by telephone at the time of interpretation on 09/13/2018 at 5:29 am to tele neurology nurse, who verbally acknowledged these results. Electronically Signed   By: Mitzi HansenLance  Furusawa-Stratton M.D.   On: 09/13/2018 05:32   Dg Chest Portable 1 View  Result Date: 09/13/2018 CLINICAL DATA:  Cough, fever and shortness of breath with weakness and headache. EXAM: PORTABLE CHEST 1 VIEW COMPARISON:  None. FINDINGS: Lungs are adequately inflated without lobar consolidation or effusion. Cardiomediastinal silhouette, bones and soft tissues are normal. IMPRESSION: No active disease. Electronically Signed   By: Elberta Fortisaniel  Boyle M.D.   On: 09/13/2018 08:39    Assessment: 38 y.o. male with sudden onset of left sided numbness including face/arm and leg. Initial NIH 5. Patient is s/p tpa ( verbal order at 5:17) administration. CT/CTA with no acute abnlities or LVO.   Plan: Neurology:   Acute onset left sided weakness - Concerns for ischemic event vs complicated migrain given his PMHX.Post IV TPA.  CTA head and neck shows no large vessel occlusion. - Admit to ICU with critical care service following - Neuro protectives measures including normothermia, normoglycemia, correct electrolytes/metabolic abnlities. - Freq neuro checks - Vital signs/blood pressure with neuro checks/NIHSS  per tPA protocol for the first 24 hours  - No lines, catheters, antiplatelet or anticoagulants x24 hr after IV tPA unless required  - keep BP <185/100 with short-acting antihypertensives (Labetolol, Hydralazine or Nicardipine Gtt as needed).  - order MRI brain without contrast per stroke protocol - CheckTTE to r/o cardioembolic source, assess EF  - check  fasting Lipid Panel with Direct LDL in AM.  - check HgA1c, TSH  - Will repeat head CT in 24 hours s/p IV Alteplase per protocol. - Place SCDs for DVT prevention. - NPO for now until passes swallow screen  - PT consult, OT consult, Speech consult - Aspiration precautions, Bleeding precautions - Obtain STAT head CT for any new acute headache or new neurological deficits  2.  HLD  + Goal LDL<100 -  Start Atorvastatin 20mg  PO qhs  3. Bipolar Disorder - Currently not on any medication, Per wife needs to be on Lamotrigine - May need Psych consult during this admission  4. DVT prophylaxis - Hold anti-coagulation for post IV alteplase  Tomi BambergerGuerch, MD Neurology/Neurocritical care 09/13/2018, 9:23 AM

## 2018-09-13 NOTE — ED Notes (Signed)
Pt states his spouse and daughter are at home with "possible covid". Pt states his spouse works at [employer]. Pt states he has had a cough, fever, shortness of breath, weakness and headache.

## 2018-09-13 NOTE — ED Notes (Signed)
Patient using restroom. 

## 2018-09-13 NOTE — ED Notes (Signed)
Pt taken to MRI via wheelchair.

## 2018-09-13 NOTE — ED Notes (Signed)
Attempted report. Lovena Le RN from ICU reported when she approves bed and we see it is approved, I can call report.

## 2018-09-13 NOTE — Progress Notes (Signed)
PT Cancellation Note  Patient Details Name: Anthony Crawford MRN: [id number] DOB: Aug 11, 1980   Cancelled Treatment:    Reason Eval/Treat Not Completed: Other (comment)(Chart reviewed, pt recieved tPA at 5:27am on 09/13/2018. Per PT practice guidelines, PT to hold evaluation until 24hrs post tPA administration and follow up imaging performed.)   Lieutenant Diego PT, DPT 1:05 PM,09/13/18 [phone]

## 2018-09-13 NOTE — Consult Note (Signed)
[facility] Tele[facility]urology Consult Services   Date of Service:   09/13/2018 04:50:45  Impression:     .  Right Hemispheric Infarct  Comments/Sign-Out: Patient presented with symptoms of possible COVID and left-sided weakness. We talked in detail about treatment options and I explained to him the IV TPA pros and cons. He agreed with this and was given IV TPA without any complications. We will get an MRI of the head, echocardiogram and further work-up. All this was discussed in detail with the ED attending. He will probably need hypercoagulable work-up also.  Mechanism of Stroke: Possible Thromboembolic  Metrics: Last Known Well: 09/13/2018 04:00:00 [facility] Notification Time: 09/13/2018 04:50:45 Arrival Time: 09/13/2018 04:34:00 Stamp Time: 09/13/2018 04:50:45 Time First Login Attempt: 09/13/2018 04:54:00 Video Start Time: 09/13/2018 04:54:00  Symptoms: left sided facial numbness NIHSS Start Assessment Time: 09/13/2018 05:12:28 tPA Verbal Order Time: 09/13/2018 05:17:06 Patient is a candidate for tPA. tPA CPOE Order Time: 09/13/2018 05:20:19 Needle Time: 09/13/2018 05:27:30 Weight Noted by Staff: 102.1 kg Video End Time: 09/13/2018 05:34:39 Reason for tPA Delay: Delays related to Imaging  CT head showed no acute hemorrhage or acute core infarct.  Lower Likelihood of Large Vessel Occlusion but Following Stat Studies are Recommended  CTA Head and Neck. Reviewed, No Indication of Large Vessel Occlusive Thrombus, Patient is not an NIR Candidate.   ED Physician notified of diagnostic impression and management plan on 09/13/2018 05:33:59  Verbal Consent to tPA: I have explained to the Patient the nature of the patient's condition, the use of tPA fibrinolytic agent, and the benefits to be reasonably expected compared with alternative approaches. I have discussed the likelihood of major risks or complications of this procedure including (if applicable) but  not limited to loss of limb function, brain damage, paralysis, hemorrhage, infection, complications from transfusion of blood components, drug reactions, blood clots and loss of life. I have also indicated that with any procedure there is always the possibility of an unexpected complication. All questions were answered and Patient express understanding of the treatment plan and consent to the treatment.  Our recommendations are outlined below.  Recommendations: IV tPA recommended.  tPA bolus given Without Complication.   IV tPA Total Dose - 90.0 mg IV tPA Bolus Dose - 9.0 mg IV tPA Infusion Dose - 81.0 mg  Routine post tPA monitoring including neuro checks and blood pressure control during/after treatment Monitor blood pressure Check blood pressure and NIHSS every 15 min for 2 h, then every 30 min for 6 h, and finally every hour for 16 h.  Manage Blood Pressure per post tPA protocol.      .  Admission to ICU     .  CT brain 24 hours post tPA     .  NPO until swallowing screen performed and passed     .  No antiplatelet agents or anticoagulants (including heparin for DVT prophylaxis) in first 24 hours     .  No Foley catheter, nasogastric tube, arterial catheter or central venous catheter for 24 hr, unless absolutely necessary     .  Telemetry     .  Bedside swallow evaluation     .  HOB less than 30 degrees     .  Euglycemia     .  Avoid hyperthermia, PRN acetaminophen     .  DVT prophylaxis     .  Inpatient Neurology Consultation     .  Stroke evaluation as per inpatient neurology recommendations  Discussed with  ED physician    ------------------------------------------------------------------------------  History of Present Illness: Patient is a 38 year old Male.  Patient was brought by private transportation with symptoms of left sided facial numbness  38 year old male with history of morbid obesity reports that he has been having some shortness of breath, cough and  fever for the past few days. He reports he was up at 4:00 as he could not go to sleep and developed sudden onset left-sided numbness in the arm and leg. He denies any problem with his swallowing but did report some speech issues. Denies any major headaches. Denies any seizure-like activity. Denies any previous history of strokes. He does not take any anticoagulants.  Last seen normal was within 4.5 hours. There is no history of Recent Anticoagulants. There is no history of recent major surgery.  Examination: BP(134/92), Pulse(99), Blood Glucose(111) 1A: Level of Consciousness - Alert; keenly responsive + 0 1B: Ask Month and Age - Both Questions Right + 0 1C: Blink Eyes & Squeeze Hands - Performs Both Tasks + 0 2: Test Horizontal Extraocular Movements - Normal + 0 3: Test Visual Fields - No Visual Loss + 0 4: Test Facial Palsy (Use Grimace if Obtunded) - Minor paralysis (flat nasolabial fold, smile asymmetry) + 1 5A: Test Left Arm Motor Drift - Drift, but doesn't hit bed + 1 5B: Test Right Arm Motor Drift - No Drift for 10 Seconds + 0 6A: Test Left Leg Motor Drift - Some Effort Against Gravity + 2 6B: Test Right Leg Motor Drift - No Drift for 5 Seconds + 0 7: Test Limb Ataxia (FNF/Heel-Shin) - No Ataxia + 0 8: Test Sensation - Mild-Moderate Loss: Less Sharp/More Dull + 1 9: Test Language/Aphasia - Normal; No aphasia + 0 10: Test Dysarthria - Normal + 0 11: Test Extinction/Inattention - No abnormality + 0  NIHSS Score: 5  Patient/Family was informed the Neurology Consult would happen via TeleHealth consult by way of interactive audio and video telecommunications and consented to receiving care in this manner.  Due to the immediate potential for life-threatening deterioration due to underlying acute neurologic illness, I spent 35 minutes providing critical care. This time includes time for face to face visit via telemedicine, review of medical records, imaging studies and discussion of  findings with providers, the patient and/or family.   Dr Hedy CamaraMurtaza Jaysiah Marchetta   [facility] [phone]  Case [id number]

## 2018-09-13 NOTE — H&P (Addendum)
[facility] [place] [facility]   PATIENT NAME: Anthony Crawford    MR#:  [id number]  DATE OF BIRTH:  1980/07/23  DATE OF ADMISSION:  09/13/2018  PRIMARY CARE PHYSICIAN: Patient, No Pcp Per   REQUESTING/REFERRING PHYSICIAN: [name], MD  CHIEF COMPLAINT:   Chief Complaint  Patient presents with   Numbness    HISTORY OF PRESENT ILLNESS:  38 y.o. male with pertinent past medical history of hyperlipidemia and bipolar disorder presenting to the ED with chief complaints of left-sided numbness and weakness.  Patient reports onset of symptoms since 09/13/2018 at about 04:00 with progressive worsening of symptoms.  Reports left facial and left arm numbness and weakness.  Denies associated altered sensorium, speech abnormality, cranial nerve deficit, seizures, diplopia, nausea or vomiting, headachesyncope or LOC, paresthesia (numbness, tingling, pins-and-needles sensation) or a heavy feeling in an extremity.  Patient states he has not been feeling well since Monday, was evaluated at urgent care for shortness of breath, cough, fever and generalized weakness prior to onset of symptoms.  Due to worsening symptoms patient decided to present to the ED for further evaluation of a possible stroke.  On arrival to the ED, he was afebrile with blood pressure 127/81 mm Hg and pulse rate 84 beats/min.  He was noted to have left facial droop, left facial and arm numbness and weakness.  Initial NIH stroke scale was 5. Code stroke was initiated and patient was evaluated by a tele-neurologist.  CTA head and neck was obtained which showed no large vessel occlusion or acute intracranial abnormality.  Patient was deemed a candidate for IV alteplase. Patient tolerated tPA without complication.  On exam, there were no focal neurologic deficit, patient reported resolution of symptoms with mild residual weakness in his left lower leg. Patient reported he was not on any antiplatelet or anticoagulation  prior to this episode.  Initial labs in the ED including COVID-19 unremarkable.  Chest x-ray shows no active cardiopulmonary process.  Patient is being admitted under hospitalist service in the ICU for further monitoring and management.  PAST MEDICAL HISTORY:   Past Medical History:  Diagnosis Date   Anxiety    Depression     PAST SURGICAL HISTORY:  No past surgical history on file.  SOCIAL HISTORY:   Social History   Tobacco Use   Smoking status: Current Some Day Smoker  Substance Use Topics   Alcohol use: Yes    FAMILY HISTORY:   Family History  Problem Relation Age of Onset   Lupus Mother    Diabetes Father     DRUG ALLERGIES:   Allergies  Allergen Reactions   Other Other (See Comments)    Cactus, Patient states that he will die if he touches it.    Strawberry Extract Itching   [employer] Sunrise Flavor Other (See Comments)    Patient states that he will die.    REVIEW OF SYSTEMS:   Review of Systems  Constitutional: Negative for chills, fever, malaise/fatigue and weight loss.  HENT: Negative for congestion, hearing loss and sore throat.   Eyes: Negative for blurred vision and double vision.  Respiratory: Negative for cough, shortness of breath and wheezing.   Cardiovascular: Negative for chest pain, palpitations, orthopnea and leg swelling.  Gastrointestinal: Negative for abdominal pain, diarrhea, nausea and vomiting.  Genitourinary: Negative for dysuria and urgency.  Musculoskeletal: Negative for back pain and myalgias.  Skin: Negative for rash.  Neurological: Negative for dizziness, sensory change, speech change, focal weakness  and headaches.  Psychiatric/Behavioral: Positive for depression. The patient is nervous/anxious.    MEDICATIONS AT HOME:   Prior to Admission medications   Medication Sig Start Date End Date Taking? Authorizing Provider  HYDROcodone-acetaminophen (NORCO) 5-325 MG per tablet 1-2 po q4h prn pain. Patient not taking:  Reported on 09/13/2018 10/22/10   Lily Kocher, PA-C  ibuprofen (ADVIL,MOTRIN) 200 MG tablet Take 200 mg by mouth every 6 (six) hours as needed. Pain     [provider]  methocarbamol (ROBAXIN) 500 MG tablet 2 po tid for spasm Patient not taking: Reported on 09/13/2018 10/22/10   Lily Kocher, PA-C      VITAL SIGNS:  Blood pressure 115/76, pulse 73, temperature 98.5 F (36.9 C), temperature source Oral, resp. rate 19, height 5\' 11"  (1.803 m), weight 102.1 kg, SpO2 94 %.  PHYSICAL EXAMINATION:   Physical Exam  GENERAL:  38 y.o.-year-old patient lying in the bed with no acute distress.  EYES: Pupils equal, round, reactive to light and accommodation. No scleral icterus. Extraocular muscles intact.  HEENT: Head atraumatic, normocephalic. Oropharynx and nasopharynx clear.  NECK:  Supple, no jugular venous distention. No thyroid enlargement, no tenderness.  LUNGS: Normal breath sounds bilaterally, no wheezing, rales,rhonchi or crepitation. No use of accessory muscles of respiration.  CARDIOVASCULAR: S1, S2 normal. No murmurs, rubs, or gallops.  ABDOMEN: Soft, nontender, nondistended. Bowel sounds present. No organomegaly or mass.  EXTREMITIES: No pedal edema, cyanosis, or clubbing. No rash or lesions. + pedal pulses MUSCULOSKELETAL: Normal bulk, and power was 5+ grip and elbow, knee, and ankle flexion and extension bilaterally.  NEUROLOGIC:Alert and oriented x 3. CN 2-12 intact. Sensation to light touch and cold stimuli intact bilaterally. Finger to nose nl. Babinski is downgoing. DTR's (biceps, patellar, and achilles) 2+ and symmetric throughout. Gait not tested due to safety concern. PSYCHIATRIC: The patient is alert and oriented x 3.  SKIN: No obvious rash, lesion, or ulcer.   DATA REVIEWED:  LABORATORY PANEL:   CBC Recent Labs  Lab 09/13/18 0449  WBC 8.8  HGB 14.1  HCT 42.1  PLT 297    ------------------------------------------------------------------------------------------------------------------  Chemistries  Recent Labs  Lab 09/13/18 0449  NA 135  K 3.6  CL 101  CO2 24  GLUCOSE 119*  BUN 12  CREATININE 1.07  CALCIUM 8.6*  AST 21  ALT 23  ALKPHOS 53  BILITOT 0.4   ------------------------------------------------------------------------------------------------------------------  Cardiac Enzymes No results for input(s): TROPONINI in the last 168 hours. ------------------------------------------------------------------------------------------------------------------  RADIOLOGY:  Ct Code Stroke Cta Head W/wo Contrast  Result Date: 09/13/2018 CLINICAL DATA:  38 y/o  M; progressive left-sided weakness. EXAM: CT ANGIOGRAPHY HEAD AND NECK TECHNIQUE: Multidetector CT imaging of the head and neck was performed using the standard protocol during bolus administration of intravenous contrast. Multiplanar CT image reconstructions and MIPs were obtained to evaluate the vascular anatomy. Carotid stenosis measurements (when applicable) are obtained utilizing NASCET criteria, using the distal internal carotid diameter as the denominator. CONTRAST:  132mL ISOVUE-370 IOPAMIDOL (ISOVUE-370) INJECTION 76% COMPARISON:  None. FINDINGS: CT HEAD FINDINGS Brain: No evidence of acute infarction, hemorrhage, hydrocephalus, extra-axial collection or mass lesion/mass effect. ASPECTS is 10. Vascular: As below. Skull: Normal. Negative for fracture or focal lesion. Sinuses: Imaged portions are clear. Orbits: No acute finding. Review of the MIP images confirms the above findings CTA NECK FINDINGS Aortic arch: Standard branching. Imaged portion shows no evidence of aneurysm or dissection. No significant stenosis of the major arch vessel origins. Right carotid system: No evidence of  dissection, stenosis (50% or greater) or occlusion. Left carotid system: No evidence of dissection, stenosis (50% or  greater) or occlusion. Vertebral arteries: Codominant. No evidence of dissection, stenosis (50% or greater) or occlusion. Skeleton: Negative. Other neck: Negative. Upper chest: Negative. Review of the MIP images confirms the above findings CTA HEAD FINDINGS Anterior circulation: No significant stenosis, proximal occlusion, aneurysm, or vascular malformation. Posterior circulation: No significant stenosis, proximal occlusion, aneurysm, or vascular malformation. Venous sinuses: As permitted by contrast timing, patent. Anatomic variants: None significant. Review of the MIP images confirms the above findings IMPRESSION: 1. Negative noncontrast CT of the head.  ASPECTS is 10. 2. Patent carotid and vertebral arteries. No dissection, aneurysm, or hemodynamically significant stenosis utilizing NASCET criteria. 3. Patent anterior and posterior intracranial circulation. No large vessel occlusion, aneurysm, or significant stenosis. Noncontrast CT head results were called by telephone at the time of interpretation on 09/13/2018 at 5:24 am to tele neurology nurse who verbally acknowledged these results. CT angiogram results were called by telephone at the time of interpretation on 09/13/2018 at 5:29 am to tele neurology nurse, who verbally acknowledged these results. Electronically Signed   By: Mitzi HansenLance  Furusawa-Stratton M.D.   On: 09/13/2018 05:32   Ct Code Stroke Cta Neck W/wo Contrast  Result Date: 09/13/2018 CLINICAL DATA:  38 y/o  M; progressive left-sided weakness. EXAM: CT ANGIOGRAPHY HEAD AND NECK TECHNIQUE: Multidetector CT imaging of the head and neck was performed using the standard protocol during bolus administration of intravenous contrast. Multiplanar CT image reconstructions and MIPs were obtained to evaluate the vascular anatomy. Carotid stenosis measurements (when applicable) are obtained utilizing NASCET criteria, using the distal internal carotid diameter as the denominator. CONTRAST:  100mL ISOVUE-370  IOPAMIDOL (ISOVUE-370) INJECTION 76% COMPARISON:  None. FINDINGS: CT HEAD FINDINGS Brain: No evidence of acute infarction, hemorrhage, hydrocephalus, extra-axial collection or mass lesion/mass effect. ASPECTS is 10. Vascular: As below. Skull: Normal. Negative for fracture or focal lesion. Sinuses: Imaged portions are clear. Orbits: No acute finding. Review of the MIP images confirms the above findings CTA NECK FINDINGS Aortic arch: Standard branching. Imaged portion shows no evidence of aneurysm or dissection. No significant stenosis of the major arch vessel origins. Right carotid system: No evidence of dissection, stenosis (50% or greater) or occlusion. Left carotid system: No evidence of dissection, stenosis (50% or greater) or occlusion. Vertebral arteries: Codominant. No evidence of dissection, stenosis (50% or greater) or occlusion. Skeleton: Negative. Other neck: Negative. Upper chest: Negative. Review of the MIP images confirms the above findings CTA HEAD FINDINGS Anterior circulation: No significant stenosis, proximal occlusion, aneurysm, or vascular malformation. Posterior circulation: No significant stenosis, proximal occlusion, aneurysm, or vascular malformation. Venous sinuses: As permitted by contrast timing, patent. Anatomic variants: None significant. Review of the MIP images confirms the above findings IMPRESSION: 1. Negative noncontrast CT of the head.  ASPECTS is 10. 2. Patent carotid and vertebral arteries. No dissection, aneurysm, or hemodynamically significant stenosis utilizing NASCET criteria. 3. Patent anterior and posterior intracranial circulation. No large vessel occlusion, aneurysm, or significant stenosis. Noncontrast CT head results were called by telephone at the time of interpretation on 09/13/2018 at 5:24 am to tele neurology nurse who verbally acknowledged these results. CT angiogram results were called by telephone at the time of interpretation on 09/13/2018 at 5:29 am to tele  neurology nurse, who verbally acknowledged these results. Electronically Signed   By: Mitzi HansenLance  Furusawa-Stratton M.D.   On: 09/13/2018 05:32   EKG:  EKG: normal EKG, normal sinus rhythm, unchanged  from previous tracings.  IMPRESSION AND PLAN:   38 y.o. male with pertinent past medical history of hyperlipidemia and bipolar disorder presenting to the ED with chief complaints of left-sided numbness and weakness.  1. Acute onset left sided weakness - Concerns for ischemic event  Post IV TPA.  CTA head and neck shows no large vessel occlusion. - Admit to ICU with critical care service following - Vital signs/blood pressure with neuro checks/NIHSS  per tPA protocol for the first 24 hours  - No lines, catheters, antiplatelet or anticoagulants x24 hr after IV tPA unless required  - keep BP <185/100 with short-acting antihypertensives (Labetolol, Hydralazine or Nicardipine Gtt as needed).  - order MRI brain without contrast per stroke protocol - CheckTTE to r/o cardioembolic source, assess EF  - check fasting Lipid Panel with Direct LDL in AM.  - check HgA1c, TSH  - Will repeat head CT in 24 hours s/p IV Alteplase per protocol. - Place SCDs for DVT prevention. - NPO for now until passes swallow screen  - PT consult, OT consult, Speech consult - Aspiration precautions, Bleeding precautions - Obtain STAT head CT for any new acute headache or new neurological deficits  2.  HLD  + Goal LDL<100 -  Start Atorvastatin 20mg  PO qhs  3. Bipolar Disorder - Currently not on any medication, Per wife needs to be on Lamotrigine - May need Psych consult during this admission  4. DVT prophylaxis - Hold anti-coagulation for post IV alteplase   All the records are reviewed and case discussed with ED provider. Management plans discussed with the patient, family and they are in agreement.  CODE STATUS: FULL  TOTAL TIME TAKING CARE OF THIS PATIENT: 40 minutes.    on 09/13/2018 at 8:41 AM  This patient  was staffed with Dr. Sherryll BurgerShah, Vipul who personally evaluated patient, reviewed documentation and agreed with assessment and plan of care as above.  Webb SilversmithElizabeth Audrielle Vankuren, DNP, FNP-BC [facility] Nurse Practitioner Between 7am to 6pm - Pager [phone]- [phone]  After 6pm go to [email or web address] - password [email or web address]  [facility]  Office  [phone]  CC: Primary care physician; Patient, No Pcp Per

## 2018-09-13 NOTE — ED Notes (Signed)
Report received 

## 2018-09-13 NOTE — ED Notes (Signed)
Patient transported to MRI 

## 2018-09-13 NOTE — ED Notes (Signed)
Pt to ct with dawn, rn.

## 2018-09-13 NOTE — ED Notes (Signed)
Patient ambulatory to bathroom with no problems 

## 2018-09-13 NOTE — ED Notes (Signed)
TPA dose bolus and drip was verified with lea ferguson, rn prior to administration.

## 2018-09-13 NOTE — ED Notes (Signed)
Patient refused to use urinal at bedside. Patient able to ambulate into bathroom and use urinal and get back in bed no problem

## 2018-09-13 NOTE — ED Notes (Signed)
PAtient given sandwich tray and soda

## 2018-09-13 NOTE — ED Notes (Signed)
Spouse at bedside, per dr. Owens Shark allow spouse to stay.

## 2018-09-14 ENCOUNTER — Inpatient Hospital Stay: Payer: Managed Care, Other (non HMO)

## 2018-09-14 DIAGNOSIS — F3170 Bipolar disorder, currently in remission, most recent episode unspecified: Secondary | ICD-10-CM

## 2018-09-14 DIAGNOSIS — F319 Bipolar disorder, unspecified: Secondary | ICD-10-CM | POA: Diagnosis present

## 2018-09-14 LAB — BASIC METABOLIC PANEL
Anion gap: 9 (ref 5–15)
BUN: 10 mg/dL (ref 6–20)
CO2: 23 mmol/L (ref 22–32)
Calcium: 9.1 mg/dL (ref 8.9–10.3)
Chloride: 102 mmol/L (ref 98–111)
Creatinine, Ser: 0.66 mg/dL (ref 0.61–1.24)
GFR calc Af Amer: >60 mL/min (ref >60)
GFR calc non Af Amer: >60 mL/min (ref >60)
Glucose, Bld: 103 mg/dL — ABNORMAL HIGH (ref 70–99)
Potassium: 3.9 mmol/L (ref 3.5–5.1)
Sodium: 134 mmol/L — ABNORMAL LOW (ref 135–145)

## 2018-09-14 MED ORDER — LAMOTRIGINE 25 MG PO TABS: 25.0000 mg | ORAL_TABLET | Freq: Every day | ORAL | 0 refills | Status: AC

## 2018-09-14 MED ORDER — ATORVASTATIN CALCIUM 20 MG PO TABS: 20.0000 mg | ORAL_TABLET | Freq: Every day | ORAL | 11 refills | Status: DC

## 2018-09-14 MED ORDER — LAMOTRIGINE 100 MG PO TABS: 100.0000 mg | ORAL_TABLET | Freq: Every day | ORAL | 5 refills | Status: AC

## 2018-09-14 MED ORDER — ATORVASTATIN CALCIUM 20 MG PO TABS: 20.0000 mg | ORAL_TABLET | Freq: Every day | ORAL | 0 refills | Status: AC

## 2018-09-14 MED ORDER — ACETAMINOPHEN 325 MG PO TABS: 650.0000 mg | ORAL_TABLET | ORAL | Status: DC | PRN

## 2018-09-14 MED ORDER — LAMOTRIGINE 25 MG PO TABS
25.0000 mg | ORAL_TABLET | Freq: Every day | ORAL | Status: DC
  Administered 2018-09-14: 25 mg via ORAL
  Filled 2018-09-14: qty 1

## 2018-09-14 MED ORDER — ASPIRIN EC 81 MG PO TBEC: 81.0000 mg | DELAYED_RELEASE_TABLET | Freq: Every day | ORAL | 0 refills | Status: AC

## 2018-09-14 MED ORDER — LAMOTRIGINE 25 MG PO TABS: 100.0000 mg | ORAL_TABLET | Freq: Every day | ORAL | Status: DC

## 2018-09-14 NOTE — Consult Note (Signed)
[facility]   Reason for Consult:  Medication  Referring Physician:  Kandis Mannanmar, NP Patient Identification: Anthony Crawford MRN:  [id number] Principal Diagnosis: Bipolar affective disorder (HCC) Diagnosis:  Principal Problem:   Bipolar affective disorder (HCC) Active Problems:   TIA (transient ischemic attack)  Total Time spent with patient: 45 minutes  Subjective:   Anthony Crawford is a 38 y.o. male patient requested to restart his Lamcital.  He has not had it in awhile but it works for him.  His psychiatrist went into private practice and could not see him on a monthly basis.  He wants a new psychiatrist and is willing to go to one, COVID restraints have been an issue.  HPI:  38 yo male who presented to the ED with stroke symptoms.  History of bipolar disorder, stabilized on Lamictal.  Confirmed by his wife and patient.  No suicidal/homicidal ideations, hallucinations, or substance abuse.  Patient reports he is discharging today but would like to restart his medications and go to a new psychiatrist.  His psychiatrist went into practice by himself and wanted him to come to appointments monthly, which he could not afford at $250/mo.  He was not able to get his Lamictal and has been out of medications for a few months and has had difficulty obtaining a new one with the COVID restrictions.  Discussed Lamictal along with side effects (rash specifically, stop medication and call your provider immediately).  Started the Lamictal taper in hospital.    Past Psychiatric History: bipolar disorder  Risk to Self:  none Risk to Others:  none Prior Inpatient Therapy:  none Prior Outpatient Therapy:  yes  Past Medical History:  Past Medical History:  Diagnosis Date  . Anxiety   . Depression    No past surgical history on file. Family History:  Family History  Problem Relation Age of Onset  . Lupus Mother   . Diabetes Father    Family Psychiatric  History: none Social History:  Social  History   Substance and Sexual Activity  Alcohol Use Yes     Social History   Substance and Sexual Activity  Drug Use No    Social History   Socioeconomic History  . Marital status: Married    Spouse name: Not on file  . Number of children: Not on file  . Years of education: Not on file  . Highest education level: Not on file  Occupational History  . Not on file  Social Needs  . Financial resource strain: Not on file  . Food insecurity    Worry: Not on file    Inability: Not on file  . Transportation needs    Medical: Not on file    Non-medical: Not on file  Tobacco Use  . Smoking status: Current Some Day Smoker  Substance and Sexual Activity  . Alcohol use: Yes  . Drug use: No  . Sexual activity: Not on file  Lifestyle  . Physical activity    Days per week: Not on file    Minutes per session: Not on file  . Stress: Not on file  Relationships  . Social Musicianconnections    Talks on phone: Not on file    Gets together: Not on file    Attends religious service: Not on file    Active member of club or organization: Not on file    Attends meetings of clubs or organizations: Not on file    Relationship status: Not on file  Other Topics Concern  . Not on file  Social History Narrative  . Not on file   Additional Social History:    Allergies:   Allergies  Allergen Reactions  . Other Other (See Comments)    Cactus, Patient states that he will die if he touches it.   . Strawberry Extract Itching  . Tequila Dana CorporationSunrise Flavor Other (See Comments)    Patient states that he will die.    Labs:  Results for orders placed or performed during the hospital encounter of 09/13/18 (from the past 48 hour(s))  Protime-INR     Status: None   Collection Time: 09/13/18  4:49 AM  Result Value Ref Range   Prothrombin Time 13.1 11.4 - 15.2 seconds   INR 1.0 0.8 - 1.2    Comment: (NOTE) INR goal varies based on device and disease states. Performed at [facility], [address] [place], [place] [address]   APTT     Status: None   Collection Time: 09/13/18  4:49 AM  Result Value Ref Range   aPTT 31 24 - 36 seconds    Comment: Performed at [facility], [address] [place], [place] [address]  CBC     Status: None   Collection Time: 09/13/18  4:49 AM  Result Value Ref Range   WBC 8.8 4.0 - 10.5 K/uL   RBC 4.65 4.22 - 5.81 MIL/uL   Hemoglobin 14.1 13.0 - 17.0 g/dL   HCT 09.842.1 11.939.0 - 14.752.0 %   MCV 90.5 80.0 - 100.0 fL   MCH 30.3 26.0 - 34.0 pg   MCHC 33.5 30.0 - 36.0 g/dL   RDW 82.913.0 56.211.5 - 13.015.5 %   Platelets 297 150 - 400 K/uL   nRBC 0.0 0.0 - 0.2 %    Comment: Performed at [facility], [address] [place], [place] [address]  Differential     Status: None   Collection Time: 09/13/18  4:49 AM  Result Value Ref Range   Neutrophils Relative % 54 %   Neutro Abs 4.8 1.7 - 7.7 K/uL   Lymphocytes Relative 34 %   Lymphs Abs 3.0 0.7 - 4.0 K/uL   Monocytes Relative 9 %   Monocytes Absolute 0.8 0.1 - 1.0 K/uL   Eosinophils Relative 1 %   Eosinophils Absolute 0.1 0.0 - 0.5 K/uL   Basophils Relative 1 %   Basophils Absolute 0.0 0.0 - 0.1 K/uL   Immature Granulocytes 1 %   Abs Immature Granulocytes 0.04 0.00 - 0.07 K/uL    Comment: Performed at [facility], [address] [place], [place] [address]  Comprehensive metabolic panel     Status: Abnormal   Collection Time: 09/13/18  4:49 AM  Result Value Ref Range   Sodium 135 135 - 145 mmol/L   Potassium 3.6 3.5 - 5.1 mmol/L   Chloride 101 98 - 111 mmol/L   CO2 24 22 - 32 mmol/L   Glucose, Bld 119 (H) 70 - 99 mg/dL   BUN 12 6 - 20 mg/dL   Creatinine, Ser 9.521.07 0.61 - 1.24 mg/dL   Calcium 8.6 (L) 8.9 - 10.3 mg/dL   Total Protein 7.4 6.5 - 8.1 g/dL   Albumin 4.0 3.5 - 5.0 g/dL   AST 21 15 - 41 U/L   ALT 23 0 - 44 U/L   Alkaline Phosphatase 53 38 - 126 U/L   Total Bilirubin 0.4 0.3 - 1.2 mg/dL   GFR calc non  Af Amer >60 >60 mL/min   GFR calc Af Amer >60 >60  mL/min   Anion gap 10 5 - 15    Comment: Performed at [facility], [address] [place], [place] [address]  SARS Coronavirus 2 (CEPHEID- Performed in [facility] hospital lab), Hosp Order     Status: None   Collection Time: 09/13/18  4:49 AM   Specimen: Nasopharyngeal Swab  Result Value Ref Range   SARS Coronavirus 2 NEGATIVE NEGATIVE    Comment: (NOTE) If result is NEGATIVE SARS-CoV-2 target nucleic acids are NOT DETECTED. The SARS-CoV-2 RNA is generally detectable in upper and lower  respiratory specimens during the acute phase of infection. The lowest  concentration of SARS-CoV-2 viral copies this assay can detect is 250  copies / mL. A negative result does not preclude SARS-CoV-2 infection  and should not be used as the sole basis for treatment or other  patient management decisions.  A negative result may occur with  improper specimen collection / handling, submission of specimen other  than nasopharyngeal swab, presence of viral mutation(s) within the  areas targeted by this assay, and inadequate number of viral copies  (<250 copies / mL). A negative result must be combined with clinical  observations, patient history, and epidemiological information. If result is POSITIVE SARS-CoV-2 target nucleic acids are DETECTED. The SARS-CoV-2 RNA is generally detectable in upper and lower  respiratory specimens dur ing the acute phase of infection.  Positive  results are indicative of active infection with SARS-CoV-2.  Clinical  correlation with patient history and other diagnostic information is  necessary to determine patient infection status.  Positive results do  not rule out bacterial infection or co-infection with other viruses. If result is PRESUMPTIVE POSTIVE SARS-CoV-2 nucleic acids MAY BE PRESENT.   A presumptive positive result was obtained on the submitted specimen  and confirmed on repeat testing.  While 2019 novel coronavirus  (SARS-CoV-2) nucleic acids may  be present in the submitted sample  additional confirmatory testing may be necessary for epidemiological  and / or clinical management purposes  to differentiate between  SARS-CoV-2 and other Sarbecovirus currently known to infect humans.  If clinically indicated additional testing with an alternate test  methodology [phone]) is advised. The SARS-CoV-2 RNA is generally  detectable in upper and lower respiratory sp ecimens during the acute  phase of infection. The expected result is Negative. Fact Sheet for Patients:  BoilerBrush.com.cy Fact Sheet for Healthcare Providers: https://pope.com/ This test is not yet approved or cleared by the [place] FDA and has been authorized for detection and/or diagnosis of SARS-CoV-2 by FDA under an Emergency Use Authorization (EUA).  This EUA will remain in effect (meaning this test can be used) for the duration of the COVID-19 declaration under Section 564(b)(1) of the Act, 21 U.S.C. section 360bbb-3(b)(1), unless the authorization is terminated or revoked sooner. Performed at [facility], [address] [place], [place] [address]   Ethanol     Status: None   Collection Time: 09/13/18  4:49 AM  Result Value Ref Range   Alcohol, Ethyl (B) <10 <10 mg/dL    Comment: (NOTE) Lowest detectable limit for serum alcohol is 10 mg/dL. For medical purposes only. Performed at [facility], [address] [place], [place] [address]   Lipid panel     Status: Abnormal   Collection Time: 09/13/18  4:49 AM  Result Value Ref Range   Cholesterol 203 (H) 0 - 200 mg/dL  Triglycerides 224 (H) <150 mg/dL   HDL 30 (L) >40 mg/dL   Total CHOL/HDL Ratio 6.8 RATIO   VLDL 45 (H) 0 - 40 mg/dL   LDL Cholesterol 981 (H) 0 - 99 mg/dL    Comment:        Total Cholesterol/HDL:CHD Risk Coronary Heart Disease Risk Table                     Men   Women  1/2 Average Risk   3.4   3.3  Average Risk        5.0   4.4  2 X Average Risk   9.6   7.1  3 X Average Risk  23.4   11.0        Use the calculated Patient Ratio above and the CHD Risk Table to determine the patient's CHD Risk.        ATP III CLASSIFICATION (LDL):  <100     mg/dL   Optimal  191-478  mg/dL   Near or Above                    Optimal  130-159  mg/dL   Borderline  295-621  mg/dL   High  >308     mg/dL   Very High Performed at [facility], [address] [place], [place] [address]   Procalcitonin - Baseline     Status: None   Collection Time: 09/13/18  4:49 AM  Result Value Ref Range   Procalcitonin <0.10 ng/mL    Comment:        Interpretation: PCT (Procalcitonin) <= 0.5 ng/mL: Systemic infection (sepsis) is not likely. Local bacterial infection is possible. (NOTE)       Sepsis PCT Algorithm           Lower Respiratory Tract                                      Infection PCT Algorithm    ----------------------------     ----------------------------         PCT < 0.25 ng/mL                PCT < 0.10 ng/mL         Strongly encourage             Strongly discourage   discontinuation of antibiotics    initiation of antibiotics    ----------------------------     -----------------------------       PCT 0.25 - 0.50 ng/mL            PCT 0.10 - 0.25 ng/mL               OR       >80% decrease in PCT            Discourage initiation of                                            antibiotics      Encourage discontinuation           of antibiotics    ----------------------------     -----------------------------         PCT >= 0.50 ng/mL  PCT 0.26 - 0.50 ng/mL               AND        <80% decrease in PCT             Encourage initiation of                                             antibiotics       Encourage continuation           of antibiotics    ----------------------------     -----------------------------        PCT >= 0.50 ng/mL                  PCT > 0.50 ng/mL                AND         increase in PCT                  Strongly encourage                                      initiation of antibiotics    Strongly encourage escalation           of antibiotics                                     -----------------------------                                           PCT <= 0.25 ng/mL                                                 OR                                        > 80% decrease in PCT                                     Discontinue / Do not initiate                                             antibiotics Performed at [facility], [address] [place], [place] [address]   Glucose, capillary     Status: Abnormal   Collection Time: 09/13/18  5:23 AM  Result Value Ref Range   Glucose-Capillary 111 (H) 70 - 99 mg/dL  Urinalysis, Routine w reflex microscopic     Status: Abnormal   Collection Time: 09/13/18  7:52 AM  Result Value Ref Range   Color, Urine STRAW (A) YELLOW   APPearance CLEAR (A) CLEAR  Specific Gravity, Urine >1.046 (H) 1.005 - 1.030   pH 6.0 5.0 - 8.0   Glucose, UA NEGATIVE NEGATIVE mg/dL   Hgb urine dipstick SMALL (A) NEGATIVE   Bilirubin Urine NEGATIVE NEGATIVE   Ketones, ur NEGATIVE NEGATIVE mg/dL   Protein, ur NEGATIVE NEGATIVE mg/dL   Nitrite NEGATIVE NEGATIVE   Leukocytes,Ua NEGATIVE NEGATIVE   RBC / HPF 0-5 0 - 5 RBC/hpf   WBC, UA NONE SEEN 0 - 5 WBC/hpf   Bacteria, UA NONE SEEN NONE SEEN   Squamous Epithelial / LPF NONE SEEN 0 - 5    Comment: Performed at [facility], [address] [place], [place] [address]  Urine Drug Screen, Qualitative ([facility] only)     Status: None   Collection Time: 09/13/18  7:52 AM  Result Value Ref Range   Tricyclic, Ur Screen NONE DETECTED NONE DETECTED   Amphetamines, Ur Screen NONE DETECTED NONE DETECTED   MDMA (Ecstasy)Ur Screen NONE DETECTED NONE DETECTED   Cocaine Metabolite,Ur [place] NONE DETECTED NONE DETECTED   Opiate, Ur Screen NONE DETECTED NONE DETECTED    Phencyclidine (PCP) Ur S NONE DETECTED NONE DETECTED   Cannabinoid 50 Ng, Ur [place] NONE DETECTED NONE DETECTED   Barbiturates, Ur Screen NONE DETECTED NONE DETECTED   Benzodiazepine, Ur Scrn NONE DETECTED NONE DETECTED   Methadone Scn, Ur NONE DETECTED NONE DETECTED    Comment: (NOTE) Tricyclics + metabolites, urine    Cutoff 1000 ng/mL Amphetamines + metabolites, urine  Cutoff 1000 ng/mL MDMA (Ecstasy), urine              Cutoff 500 ng/mL Cocaine Metabolite, urine          Cutoff 300 ng/mL Opiate + metabolites, urine        Cutoff 300 ng/mL Phencyclidine (PCP), urine         Cutoff 25 ng/mL Cannabinoid, urine                 Cutoff 50 ng/mL Barbiturates + metabolites, urine  Cutoff 200 ng/mL Benzodiazepine, urine              Cutoff 200 ng/mL Methadone, urine                   Cutoff 300 ng/mL The urine drug screen provides only a preliminary, unconfirmed analytical test result and should not be used for non-medical purposes. Clinical consideration and professional judgment should be applied to any positive drug screen result due to possible interfering substances. A more specific alternate chemical method must be used in order to obtain a confirmed analytical result. Gas chromatography / mass spectrometry (GC/MS) is the preferred confirmat ory method. Performed at [facility], [address] [place], [place] [address]   MRSA PCR Screening     Status: None   Collection Time: 09/13/18  1:58 PM   Specimen: Nasopharyngeal  Result Value Ref Range   MRSA by PCR NEGATIVE NEGATIVE    Comment:        The GeneXpert MRSA Assay (FDA approved for NASAL specimens only), is one component of a comprehensive MRSA colonization surveillance program. It is not intended to diagnose MRSA infection nor to guide or monitor treatment for MRSA infections. Performed at [facility], [address] [place], [place] [address]   Basic metabolic panel     Status: Abnormal    Collection Time: 09/14/18  5:02 AM  Result Value Ref Range   Sodium 134 (L) 135 - 145 mmol/L  Potassium 3.9 3.5 - 5.1 mmol/L   Chloride 102 98 - 111 mmol/L   CO2 23 22 - 32 mmol/L   Glucose, Bld 103 (H) 70 - 99 mg/dL   BUN 10 6 - 20 mg/dL   Creatinine, Ser 0.66 0.61 - 1.24 mg/dL   Calcium 9.1 8.9 - 10.3 mg/dL   GFR calc non Af Amer >60 >60 mL/min   GFR calc Af Amer >60 >60 mL/min   Anion gap 9 5 - 15    Comment: Performed at [facility], [address], [place], [place] [address]    Current Facility-Administered Medications  Medication Dose Route Frequency Provider Last Rate Last Dose  .  stroke: mapping our early stages of recovery book   Does not apply Once Lang Snow, NP      . acetaminophen (TYLENOL) tablet 650 mg  650 mg Oral Q4H PRN Lang Snow, NP       Or  . acetaminophen (TYLENOL) solution 650 mg  650 mg Per Tube Q4H PRN Lang Snow, NP       Or  . acetaminophen (TYLENOL) suppository 650 mg  650 mg Rectal Q4H PRN Lang Snow, NP      . lamoTRIgine (LAMICTAL) tablet 25 mg  25 mg Oral Daily Patrecia Pour, NP   25 mg at 09/14/18 1113  . senna-docusate (Senokot-S) tablet 1 tablet  1 tablet Oral QHS PRN Lang Snow, NP        Musculoskeletal: Strength & Muscle Tone: within normal limits Gait & Station: did not witness Patient leans: N/A  Psychiatric Specialty Exam: Physical Exam  Nursing note and vitals reviewed. Constitutional: He is oriented to person, place, and time. He appears well-developed and well-nourished.  HENT:  Head: Normocephalic.  Neck: Normal range of motion.  Respiratory: Effort normal.  Musculoskeletal: Normal range of motion.  Neurological: He is alert and oriented to person, place, and time.  Psychiatric: His speech is normal and behavior is normal. Judgment and thought content normal. His mood appears anxious. His affect is blunt. Cognition and memory are normal.    Review  of Systems  Psychiatric/Behavioral: The patient is nervous/anxious.   All other systems reviewed and are negative.   Blood pressure 98/77, pulse 85, temperature 98.4 F (36.9 C), temperature source Oral, resp. rate (!) 22, height 5\' 11"  (1.803 m), weight 102.1 kg, SpO2 99 %.Body mass index is 31.39 kg/m.  General Appearance: Casual  Eye Contact:  Good  Speech:  Normal Rate  Volume:  Normal  Mood:  Anxious, mild  Affect:  Congruent  Thought Process:  Coherent and Descriptions of Associations: Intact  Orientation:  Full (Time, Place, and Person)  Thought Content:  WDL and Logical  Suicidal Thoughts:  No  Homicidal Thoughts:  No  Memory:  Immediate;   Good Recent;   Good Remote;   Good  Judgement:  Fair  Insight:  Good  Psychomotor Activity:  Normal  Concentration:  Concentration: Good and Attention Span: Good  Recall:  Good  Fund of Knowledge:  Good  Language:  Good  Akathisia:  No  Handed:  Right  AIMS (if indicated):     Assets:  Communication Skills Desire for Improvement Financial Resources/Insurance Housing Intimacy Leisure Time Physical Health Resilience Social Support Talents/Skills Transportation Vocational/Educational  ADL's:  Intact  Cognition:  WNL  Sleep:        Treatment Plan Summary: Bipolar affective disorder: -Restarted Lamictal 25 mg daily for 2 weeks,  then 50 mg daily for 2 weeks, then 100 mg daily.  Patient reports this is his stabilizing dose.  Rx provided along with lists of psychiatrists that take his insurance in [place] and [place], [place]  Disposition: No evidence of imminent risk to self or others at present.   Patient does not meet criteria for psychiatric inpatient admission. Supportive therapy provided about ongoing stressors. Discussed crisis plan, support from social network, calling 911, coming to the Emergency Department, and calling Suicide Hotline.  Nanine Means, NP 09/14/2018 1:23 PM

## 2018-09-14 NOTE — Discharge Summary (Signed)
[facility] [place] at [facility]   PATIENT NAME: Anthony Crawford    MR#:  [id number]  DATE OF BIRTH:  February 26, 1981  DATE OF ADMISSION:  09/13/2018   ADMITTING PHYSICIAN: Hannah BeatJan A Mansy, MD  DATE OF DISCHARGE: 09/14/18  PRIMARY CARE PHYSICIAN: Patient, No Pcp Per   ADMISSION DIAGNOSIS:   Cerebrovascular accident (CVA), unspecified mechanism (HCC) [I63.9]  DISCHARGE DIAGNOSIS:   Principal Problem:   Bipolar affective disorder (HCC) Active Problems:   TIA (transient ischemic attack)   SECONDARY DIAGNOSIS:   Past Medical History:  Diagnosis Date   Anxiety    Depression     HOSPITAL COURSE:  38 y.o. male with pertinent past medical history of hyperlipidemia and bipolar disorder presenting to the ED with chief complaints of acute left-sided numbness and weakness.  1. Acute onset left sided weakness - Initial concerns for ischemic event however stroke work up was negative. Due to persistent symptoms, patient received IV alteplase.  CTA head and neck shows no large vessel occlusion. MRI showed no ischemic infarct. - Symptoms resolved without residual deficit - Patient tolerated tPA without complication. Imaging at 24 hours shows no hemorrhage. - He was on  No antithrombotic prior to admission.  - started on aspirin 81 mg daily for secondary stroke prevention. - Echocardiogram did not show cardiac source of emboli  - Follow up with outpatient Neurology  2.  HLD  - LDL on 09/13/18 was 128 + Goal LDL<70 -  Start Atorvastatin 20mg  PO qhs - Follow up with PCP as scheduled  3. Bipolar Disorder - Currently not on any medication, Per wife needs to be on Lamotrigine - Patient seen by inpatient psychiatrist. - Restart Lamotrigine, resources provided - Recommend to follow up with his psychiatrist  DISCHARGE CONDITIONS:   Stable CONSULTS OBTAINED:    DRUG ALLERGIES:   Allergies  Allergen Reactions   Other Other (See Comments)    Cactus, Patient states  that he will die if he touches it.    Strawberry Extract Itching   [employer]moliveequila Sunrise Flavor Other (See Comments)    Patient states that he will die.   DISCHARGE MEDICATIONS:   Allergies as of 09/14/2018      Reactions   Other Other (See Comments)   Cactus, Patient states that he will die if he touches it.    Strawberry Extract Itching   [employer]Palmoliveequila Sunrise Flavor Other (See Comments)   Patient states that he will die.      Medication List    STOP taking these medications   HYDROcodone-acetaminophen 5-325 MG tablet Commonly known as: NORCO/VICODIN   methocarbamol 500 MG tablet Commonly known as: ROBAXIN     TAKE these medications   aspirin EC 81 MG tablet Take 1 tablet (81 mg total) by mouth daily.   atorvastatin 20 MG tablet Commonly known as: Lipitor Take 1 tablet (20 mg total) by mouth daily.   ibuprofen 200 MG tablet Commonly known as: ADVIL Take 200 mg by mouth every 6 (six) hours as needed. Pain   lamoTRIgine 25 MG tablet Commonly known as: LAMICTAL Take 1 tablet (25 mg total) by mouth daily. Start taking on: September 15, 2018   lamoTRIgine 100 MG tablet Commonly known as: LAMICTAL Take 1 tablet (100 mg total) by mouth daily. Patient to start on July 22.  Dose of 25 mg started on 6/21 with titration to 100 mg to start on 7/22 as he awaits to see his psychiatrist. Start taking on: September 15, 2018       DISCHARGE INSTRUCTIONS:    DIET:   Low fat, Low cholesterol diet  ACTIVITY:   Activity as tolerated  OXYGEN:   Home Oxygen: No.  Oxygen Delivery: room air  DISCHARGE LOCATION:   home   If you experience worsening of your admission symptoms, develop shortness of breath, life threatening emergency, suicidal or homicidal thoughts you must seek medical attention immediately by calling 911 or calling your MD immediately  if symptoms less severe.  You Must read complete instructions/literature along with all the possible adverse reactions/side effects for all  the Medicines you take and that have been prescribed to you. Take any new Medicines after you have completely understood and accpet all the possible adverse reactions/side effects.   Please note  You were cared for by a hospitalist during your hospital stay. If you have any questions about your discharge medications or the care you received while you were in the hospital after you are discharged, you can call the unit and asked to speak with the hospitalist on call if the hospitalist that took care of you is not available. Once you are discharged, your primary care physician will handle any further medical issues. Please note that NO REFILLS for any discharge medications will be authorized once you are discharged, as it is imperative that you return to your primary care physician (or establish a relationship with a primary care physician if you do not have one) for your aftercare needs so that they can reassess your need for medications and monitor your lab values.  On the day of Discharge:  VITAL SIGNS:   Blood pressure 98/77, pulse 85, temperature 98.4 F (36.9 C), temperature source Oral, resp. rate (!) 22, height 5\' 11"  (1.803 m), weight 102.1 kg, SpO2 99 %.  PHYSICAL EXAMINATION:    GENERAL:  38 y.o.-year-old patient lying in the bed with no acute distress.  EYES: Pupils equal, round, reactive to light and accommodation. No scleral icterus. Extraocular muscles intact.  HEENT: Head atraumatic, normocephalic. Oropharynx and nasopharynx clear.  NECK:  Supple, no jugular venous distention. No thyroid enlargement, no tenderness.  LUNGS: Normal breath sounds bilaterally, no wheezing, rales,rhonchi or crepitation. No use of accessory muscles of respiration.  CARDIOVASCULAR: S1, S2 normal. No murmurs, rubs, or gallops.  ABDOMEN: Soft, non-tender, non-distended. Bowel sounds present. No organomegaly or mass.  EXTREMITIES: No pedal edema, cyanosis, or clubbing.  NEUROLOGIC: Cranial nerves II  through XII are intact. Muscle strength 5/5 in all extremities. Sensation intact. Gait not checked.  PSYCHIATRIC: The patient is alert and oriented x 3.  SKIN: No obvious rash, lesion, or ulcer.   DATA REVIEW:   CBC Recent Labs  Lab 09/13/18 0449  WBC 8.8  HGB 14.1  HCT 42.1  PLT 297    Chemistries  Recent Labs  Lab 09/13/18 0449 09/14/18 0502  NA 135 134*  K 3.6 3.9  CL 101 102  CO2 24 23  GLUCOSE 119* 103*  BUN 12 10  CREATININE 1.07 0.66  CALCIUM 8.6* 9.1  AST 21  --   ALT 23  --   ALKPHOS 53  --   BILITOT 0.4  --      Microbiology Results  Results for orders placed or performed during the hospital encounter of 09/13/18  SARS Coronavirus 2 (CEPHEID- Performed in [facility] hospital lab), Hosp Order     Status: None   Collection Time: 09/13/18  4:49 AM   Specimen: Nasopharyngeal Swab  Result  Value Ref Range Status   SARS Coronavirus 2 NEGATIVE NEGATIVE Final    Comment: (NOTE) If result is NEGATIVE SARS-CoV-2 target nucleic acids are NOT DETECTED. The SARS-CoV-2 RNA is generally detectable in upper and lower  respiratory specimens during the acute phase of infection. The lowest  concentration of SARS-CoV-2 viral copies this assay can detect is 250  copies / mL. A negative result does not preclude SARS-CoV-2 infection  and should not be used as the sole basis for treatment or other  patient management decisions.  A negative result may occur with  improper specimen collection / handling, submission of specimen other  than nasopharyngeal swab, presence of viral mutation(s) within the  areas targeted by this assay, and inadequate number of viral copies  (<250 copies / mL). A negative result must be combined with clinical  observations, patient history, and epidemiological information. If result is POSITIVE SARS-CoV-2 target nucleic acids are DETECTED. The SARS-CoV-2 RNA is generally detectable in upper and lower  respiratory specimens dur ing the acute  phase of infection.  Positive  results are indicative of active infection with SARS-CoV-2.  Clinical  correlation with patient history and other diagnostic information is  necessary to determine patient infection status.  Positive results do  not rule out bacterial infection or co-infection with other viruses. If result is PRESUMPTIVE POSTIVE SARS-CoV-2 nucleic acids MAY BE PRESENT.   A presumptive positive result was obtained on the submitted specimen  and confirmed on repeat testing.  While 2019 novel coronavirus  (SARS-CoV-2) nucleic acids may be present in the submitted sample  additional confirmatory testing may be necessary for epidemiological  and / or clinical management purposes  to differentiate between  SARS-CoV-2 and other Sarbecovirus currently known to infect humans.  If clinically indicated additional testing with an alternate test  methodology [phone] is advised. The SARS-CoV-2 RNA is generally  detectable in upper and lower respiratory sp ecimens during the acute  phase of infection. The expected result is Negative. Fact Sheet for Patients:  BoilerBrush.com.cyhttps://www.fda.gov/media/136312/download Fact Sheet for Healthcare Providers: https://pope.com/https://www.fda.gov/media/136313/download This test is not yet approved or cleared by the [place] FDA and has been authorized for detection and/or diagnosis of SARS-CoV-2 by FDA under an Emergency Use Authorization (EUA).  This EUA will remain in effect (meaning this test can be used) for the duration of the COVID-19 declaration under Section 564(b)(1) of the Act, 21 U.S.C. section 360bbb-3(b)(1), unless the authorization is terminated or revoked sooner. Performed at [facility], [address] [place], [place] [address]   MRSA PCR Screening     Status: None   Collection Time: 09/13/18  1:58 PM   Specimen: Nasopharyngeal  Result Value Ref Range Status   MRSA by PCR NEGATIVE NEGATIVE Final    Comment:        The GeneXpert MRSA  Assay (FDA approved for NASAL specimens only), is one component of a comprehensive MRSA colonization surveillance program. It is not intended to diagnose MRSA infection nor to guide or monitor treatment for MRSA infections. Performed at [facility], [address] [place], [place] [address]     RADIOLOGY:  Ct Head Wo Contrast  Result Date: 09/14/2018 CLINICAL DATA:  Stroke-like symptoms post tPA. EXAM: CT HEAD WITHOUT CONTRAST TECHNIQUE: Contiguous axial images were obtained from the base of the skull through the vertex without intravenous contrast. COMPARISON:  CT head 09/13/2018 FINDINGS: Brain: No evidence of acute infarction, hemorrhage, hydrocephalus, extra-axial collection or mass lesion/mass effect. Vascular: Negative for  hyperdense vessel Skull: Negative Sinuses/Orbits: Negative Other: None IMPRESSION: Negative CT head. Electronically Signed   By: Marlan Palauharles  Clark M.D.   On: 09/14/2018 11:56    Management plans discussed with the patient, family and they are in agreement.  CODE STATUS:     Code Status Orders  (From admission, onward)         Start     Ordered   09/13/18 0739  Full code  Continuous     09/13/18 0740        Code Status History    This patient has a current code status but no historical code status.   Advance Care Planning Activity      TOTAL TIME TAKING CARE OF THIS PATIENT: 38 minutes.   Webb SilversmithElizabeth Tameya Kuznia, DNP, FNP-BC Hospitalist Nurse Practitioner    09/14/2018 at 2:08 PM  Between 7am to 6pm - Pager - [phone]  After 6pm go to [email or web address] - Social research officer, governmentpassword [email or web address] [email or web address]  [facility] [facility]  Office  [phone]  CC: Primary care physician; Patient, No Pcp Per   Note: This dictation was prepared with Dragon dictation along with smaller phrase technology. Any transcriptional errors that result from this process are unintentional.

## 2018-09-14 NOTE — Plan of Care (Signed)
Patient progressing well

## 2018-09-14 NOTE — Progress Notes (Signed)
Patient is more than 24 hours out since TPA.  Transfer to [place] floor has been ordered.  PCCM service will sign off.  Please call as needed.  Merton Border, MD PCCM service Mobile [phone] Pager [phone] 09/14/2018 1:00 PM

## 2018-09-14 NOTE — Progress Notes (Signed)
CODE STROKE- PHARMACY COMMUNICATION   Time CODE STROKE called/page received: [id number]  Time response to CODE STROKE was made (in person or via phone):   Time Stroke Kit retrieved from [facility] (only if needed): 0549  Name of Provider/Nurse contacted: April Brumgard--dose verified with ED RN and went over bolus dose of 10% (which would be 9 mg IV over 1 minute and the remaining 81 mg IV over 60 minutes--instructed nurse to discard 10 mls to give remaining 90 mg as the 9 mg bolus followed by the 81 mg dose over an hour.)  Past Medical History:  Diagnosis Date  . Anxiety   . Depression    Prior to Admission medications   Medication Sig Start Date End Date Taking? Authorizing Provider  HYDROcodone-acetaminophen (NORCO) 5-325 MG per tablet 1-2 po q4h prn pain. Patient not taking: Reported on 09/13/2018 10/22/10   Lily Kocher, PA-C  ibuprofen (ADVIL,MOTRIN) 200 MG tablet Take 200 mg by mouth every 6 (six) hours as needed. Pain     [provider]  methocarbamol (ROBAXIN) 500 MG tablet 2 po tid for spasm Patient not taking: Reported on 09/13/2018 10/22/10   Lily Kocher, PA-C    Tobie Lords ,PharmD Clinical Pharmacist  09/14/2018  12:44 AM

## 2018-09-14 NOTE — Progress Notes (Signed)
Patient went to and returned from CT scan of the brain at this time.

## 2018-09-14 NOTE — Progress Notes (Signed)
PT Cancellation Note  Patient Details Name: Anthony Crawford MRN: [id number] DOB: May 20, 1980   Cancelled Treatment:    Reason Eval/Treat Not Completed: Other (comment)(PT entered room, pt standing in room with RN, reported that he has returned to baseline, no remaining numbness/tingling. Per pt, chart review, and RN, no acute PT needs indicated, PT to sign off.)   Lieutenant Diego PT, DPT 2:55 PM,09/14/18 [phone]

## 2018-09-14 NOTE — Progress Notes (Signed)
Stroke education booklet given to and reviewed with patient.

## 2018-09-14 NOTE — Progress Notes (Signed)
Patient escorted by RN out of facility at this time.  All belongings with patient.  Wife picked patient up outside of the [place].

## 2018-09-15 ENCOUNTER — Other Ambulatory Visit (HOSPITAL_COMMUNITY): Payer: Self-pay | Admitting: Psychiatry

## 2018-09-15 LAB — ECHOCARDIOGRAM COMPLETE
Height: 71 in
Weight: 3601.43 oz

## 2018-09-15 LAB — HEMOGLOBIN A1C
Hgb A1c MFr Bld: 5.6 % (ref 4.8–5.6)
Mean Plasma Glucose: 114 mg/dL

## 2018-09-15 LAB — GLUCOSE, CAPILLARY: Glucose-Capillary: 119 mg/dL — ABNORMAL HIGH (ref 70–99)

## 2018-09-16 LAB — HIV ANTIBODY (ROUTINE TESTING W REFLEX): HIV Screen 4th Generation wRfx: NONREACTIVE

## 2020-03-12 ENCOUNTER — Encounter: Payer: Self-pay | Admitting: Emergency Medicine

## 2020-03-12 ENCOUNTER — Emergency Department
Admission: EM | Admit: 2020-03-12 | Discharge: 2020-03-12 | Disposition: A | Discharge status: Home / Self Care | Payer: Managed Care, Other (non HMO) | Attending: Emergency Medicine | Admitting: Emergency Medicine

## 2020-03-12 ENCOUNTER — Emergency Department: Payer: Managed Care, Other (non HMO)

## 2020-03-12 ENCOUNTER — Other Ambulatory Visit: Payer: Self-pay

## 2020-03-12 DIAGNOSIS — Z79899 Other long term (current) drug therapy: Secondary | ICD-10-CM | POA: Insufficient documentation

## 2020-03-12 DIAGNOSIS — R109 Unspecified abdominal pain: Secondary | ICD-10-CM | POA: Diagnosis present

## 2020-03-12 DIAGNOSIS — Z7982 Long term (current) use of aspirin: Secondary | ICD-10-CM | POA: Insufficient documentation

## 2020-03-12 DIAGNOSIS — K529 Noninfective gastroenteritis and colitis, unspecified: Secondary | ICD-10-CM | POA: Diagnosis not present

## 2020-03-12 DIAGNOSIS — F17200 Nicotine dependence, unspecified, uncomplicated: Secondary | ICD-10-CM | POA: Diagnosis not present

## 2020-03-12 HISTORY — DX: Cerebral infarction, unspecified: I63.9

## 2020-03-12 LAB — LIPASE, BLOOD: Lipase: 24 U/L (ref 11–51)

## 2020-03-12 LAB — COMPREHENSIVE METABOLIC PANEL
ALT: 20 U/L (ref 0–44)
AST: 24 U/L (ref 15–41)
Albumin: 4.6 g/dL (ref 3.5–5.0)
Alkaline Phosphatase: 70 U/L (ref 38–126)
Anion gap: 11 (ref 5–15)
BUN: 14 mg/dL (ref 6–20)
CO2: 21 mmol/L — ABNORMAL LOW (ref 22–32)
Calcium: 9 mg/dL (ref 8.9–10.3)
Chloride: 102 mmol/L (ref 98–111)
Creatinine, Ser: 0.98 mg/dL (ref 0.61–1.24)
GFR, Estimated: >60 mL/min (ref >60)
Glucose, Bld: 133 mg/dL — ABNORMAL HIGH (ref 70–99)
Potassium: 3.9 mmol/L (ref 3.5–5.1)
Sodium: 134 mmol/L — ABNORMAL LOW (ref 135–145)
Total Bilirubin: 0.8 mg/dL (ref 0.3–1.2)
Total Protein: 8.6 g/dL — ABNORMAL HIGH (ref 6.5–8.1)

## 2020-03-12 LAB — CBC
HCT: 45 % (ref 39.0–52.0)
Hemoglobin: 14.9 g/dL (ref 13.0–17.0)
MCH: 30.5 pg (ref 26.0–34.0)
MCHC: 33.1 g/dL (ref 30.0–36.0)
MCV: 92 fL (ref 80.0–100.0)
Platelets: 347 10*3/uL (ref 150–400)
RBC: 4.89 MIL/uL (ref 4.22–5.81)
RDW: 13.1 % (ref 11.5–15.5)
WBC: 16.4 10*3/uL — ABNORMAL HIGH (ref 4.0–10.5)
nRBC: 0 % (ref 0.0–0.2)

## 2020-03-12 MED ORDER — ONDANSETRON HCL 4 MG/2ML IJ SOLN
4.0000 mg | Freq: Once | INTRAMUSCULAR | Status: AC | PRN
  Administered 2020-03-12: 4 mg via INTRAVENOUS
  Filled 2020-03-12: qty 2

## 2020-03-12 MED ORDER — ONDANSETRON 4 MG PO TBDP: 4.0000 mg | ORAL_TABLET | Freq: Three times a day (TID) | ORAL | 0 refills | Status: AC | PRN

## 2020-03-12 MED ORDER — LACTATED RINGERS IV BOLUS
1000.0000 mL | Freq: Once | INTRAVENOUS | Status: AC
  Administered 2020-03-12: 1000 mL via INTRAVENOUS

## 2020-03-12 MED ORDER — ONDANSETRON HCL 4 MG/2ML IJ SOLN
4.0000 mg | Freq: Once | INTRAMUSCULAR | Status: AC
  Administered 2020-03-12: 4 mg via INTRAVENOUS
  Filled 2020-03-12: qty 2

## 2020-03-12 MED ORDER — KETOROLAC TROMETHAMINE 30 MG/ML IJ SOLN
15.0000 mg | Freq: Once | INTRAMUSCULAR | Status: AC
  Administered 2020-03-12: 15 mg via INTRAVENOUS
  Filled 2020-03-12: qty 1

## 2020-03-12 MED ORDER — IOHEXOL 300 MG/ML  SOLN
100.0000 mL | Freq: Once | INTRAMUSCULAR | Status: AC | PRN
  Administered 2020-03-12: 100 mL via INTRAVENOUS

## 2020-03-12 NOTE — ED Provider Notes (Signed)
[facility] Emergency Department Provider Note  ____________________________________________  Time seen: Approximately 6:12 AM  I have reviewed the triage vital signs and the nursing notes.   HISTORY  Chief Complaint Abdominal Pain and Diarrhea   HPI Anthony Crawford is a 39 y.o. male history of bipolar, TIA, anxiety, depression who presents for evaluation of abdominal pain, vomiting and diarrhea.  Patient has had 3 days of several daily episodes of watery diarrhea nonbloody nonbilious emesis.  No melena, hematemesis, hematochezia, coffee-ground emesis.  Is complaining of diffuse cramping moderate to abdominal pain associated with it.  No fever or chills, no cough, no fever, no body aches.  Reports being vaccinated against Covid but not flu.  Denies any known sick contacts.  Household members with no similar symptoms.  Patient thinks that he ate something that was bad before symptoms started.  No recent antibiotic use.  No history of C. difficile.   Past Medical History:  Diagnosis Date  . Anxiety   . Depression   . Stroke [facility])     Patient Active Problem List   Diagnosis Date Noted  . Bipolar affective disorder (HCC) 09/14/2018  . TIA (transient ischemic attack) 09/13/2018  . Cerebrovascular accident (CVA) [facility])     History reviewed. No pertinent surgical history.  Prior to Admission medications   Medication Sig Start Date End Date Taking? Authorizing Provider  aspirin EC 81 MG tablet Take 1 tablet (81 mg total) by mouth daily. 09/14/18   Jimmye Norman, NP  atorvastatin (LIPITOR) 20 MG tablet Take 1 tablet (20 mg total) by mouth daily. 09/14/18 09/14/19  Jimmye Norman, NP  ibuprofen (ADVIL,MOTRIN) 200 MG tablet Take 200 mg by mouth every 6 (six) hours as needed. Pain     [provider]  lamoTRIgine (LAMICTAL) 100 MG tablet Take 1 tablet (100 mg total) by mouth daily. Patient to start on July 22.  Dose of 25 mg started on 6/21 with  titration to 100 mg to start on 7/22 as he awaits to see his psychiatrist. 09/15/18   Charm Rings, NP  lamoTRIgine (LAMICTAL) 25 MG tablet Take 1 tablet (25 mg total) by mouth daily. 09/15/18   Charm Rings, NP  ondansetron (ZOFRAN ODT) 4 MG disintegrating tablet Take 1 tablet (4 mg total) by mouth every 8 (eight) hours as needed. 03/12/20   Nita Sickle, MD    Allergies Other, Strawberry extract, and Tequila sunrise flavor  Family History  Problem Relation Age of Onset  . Lupus Mother   . Diabetes Father     Social History Social History   Tobacco Use  . Smoking status: Current Some Day Smoker  Substance Use Topics  . Alcohol use: Yes  . Drug use: No    Review of Systems  Constitutional: Negative for fever. Eyes: Negative for visual changes. ENT: Negative for sore throat. Neck: No neck pain  Cardiovascular: Negative for chest pain. Respiratory: Negative for shortness of breath. Gastrointestinal: + abdominal pain, vomiting and diarrhea. Genitourinary: Negative for dysuria. Musculoskeletal: Negative for back pain. Skin: Negative for rash. Neurological: Negative for headaches, weakness or numbness. Psych: No SI or HI  ____________________________________________   PHYSICAL EXAM:  VITAL SIGNS: ED Triage Vitals  Enc Vitals Group     BP 03/12/20 0115 100/68     Pulse Rate 03/12/20 0115 91     Resp 03/12/20 0115 18     Temp 03/12/20 0115 97.7 F (36.5 C)     Temp Source  03/12/20 0115 Oral     SpO2 03/12/20 0115 95 %     Weight 03/12/20 0127 198 lb (89.8 kg)     Height 03/12/20 0127 5\' 11"  (1.803 m)     Head Circumference --      Peak Flow --      Pain Score 03/12/20 0126 3     Pain Loc --      Pain Edu? --      Excl. in GC? --     Constitutional: Alert and oriented. Well appearing and in no apparent distress. HEENT:      Head: Normocephalic and atraumatic.         Eyes: Conjunctivae are normal. Sclera is non-icteric.       Mouth/Throat: Mucous  membranes are dry.       Neck: Supple with no signs of meningismus. Cardiovascular: Regular rate and rhythm. No murmurs, gallops, or rubs. 2+ symmetrical distal pulses are present in all extremities. Respiratory: Normal respiratory effort. Lungs are clear to auscultation bilaterally. No wheezes, crackles, or rhonchi.  Gastrointestinal: Soft, diffusely tender worse in the lower quadrants, and non distended with positive bowel sounds. No rebound or guarding. Genitourinary: No CVA tenderness. Musculoskeletal:  No edema, cyanosis, or erythema of extremities. Neurologic: Normal speech and language. Face is symmetric. Moving all extremities. No gross focal neurologic deficits are appreciated. Skin: Skin is warm, dry and intact. No rash noted. Psychiatric: Mood and affect are normal. Speech and behavior are normal.  ____________________________________________   LABS (all labs ordered are listed, but only abnormal results are displayed)  Labs Reviewed  COMPREHENSIVE METABOLIC PANEL - Abnormal; Notable for the following components:      Result Value   Sodium 134 (*)    CO2 21 (*)    Glucose, Bld 133 (*)    Total Protein 8.6 (*)    All other components within normal limits  CBC - Abnormal; Notable for the following components:   WBC 16.4 (*)    All other components within normal limits  LIPASE, BLOOD  URINALYSIS, COMPLETE (UACMP) WITH MICROSCOPIC   ____________________________________________  EKG  ED ECG REPORT I, 03/14/20, the attending physician, personally viewed and interpreted this ECG.  Normal sinus rhythm, rate of 88, normal intervals, normal axis, no ST elevations or depressions.  Normal EKG. ____________________________________________  RADIOLOGY  I have personally reviewed the images performed during this visit and I agree with the Radiologist's read.   Interpretation by Radiologist:  CT ABDOMEN PELVIS W CONTRAST  Result Date: 03/12/2020 CLINICAL DATA:   Abdominal pain, vomiting, and diarrhea for 5 days. EXAM: CT ABDOMEN AND PELVIS WITH CONTRAST TECHNIQUE: Multidetector CT imaging of the abdomen and pelvis was performed using the standard protocol following bolus administration of intravenous contrast. CONTRAST:  03/14/2020 OMNIPAQUE IOHEXOL 300 MG/ML  SOLN COMPARISON:  None. FINDINGS: Lower Chest: No acute findings. Hepatobiliary: No hepatic masses identified. Mild-to-moderate diffuse hepatic steatosis noted. Gallbladder is unremarkable. No evidence of biliary ductal dilatation. Pancreas:  No mass or inflammatory changes. Spleen: Within normal limits in size and appearance. Adrenals/Urinary Tract: No masses identified. No evidence of ureteral calculi or hydronephrosis. Stomach/Bowel: No evidence of obstruction, inflammatory process or abnormal fluid collections. Normal appendix visualized. Vascular/Lymphatic: No pathologically enlarged lymph nodes. No abdominal aortic aneurysm. Reproductive:  No mass or other significant abnormality. Other:  None. Musculoskeletal:  No suspicious bone lesions identified. IMPRESSION: No acute findings within the abdomen or pelvis. Hepatic steatosis. Electronically Signed   By: .D.  On: 03/12/2020 07:03      ____________________________________________   PROCEDURES  Procedure(s) performed:yes .1-3 Lead EKG Interpretation Performed by: Nita Sickle, MD Authorized by: Nita Sickle, MD     Interpretation: normal     ECG rate assessment: normal     Rhythm: sinus rhythm     Ectopy: none     Critical Care performed: no ____________________________________________   INITIAL IMPRESSION / ASSESSMENT AND PLAN / ED COURSE  39 y.o. male history of bipolar, TIA, anxiety, depression who presents for evaluation of abdominal pain, vomiting and diarrhea.  Patient is well-appearing in no distress with normal vital signs, abdomen is tender to palpation mostly on the lower quadrants with no rebound or  guarding.  Differential diagnosis including gastroenteritis versus food poisoning versus viral illness versus colitis versus diverticulitis versus appendicitis.  Labs visualized by me show leukocytosis with white count of 16.4.  Normal LFTs and lipase.  Mild hyperglycemia no evidence of DKA.  Patient has no history of diabetes.  Discussed this finding with patient recommended fasting blood glucose and hemoglobin A1c done by PCP.  Since patient is tender in the lower quadrants has an elevated white count, CT abdomen pelvis was done.  Old medical records reviewed.   _________________________ 7:09 AM on 03/12/2020 -----------------------------------------  CT negative for any acute findings, visualized by me confirmed by radiology.  Patient feels improved with no further episodes of vomiting.  Anticipate discharge once patient passes p.o. challenge.  Will discharge home with Zofran, recommended bland diet and increase oral hydration.  Discussed recommendations for return to the hospital       _____________________________________________ Please note:  Patient was evaluated in Emergency Department today for the symptoms described in the history of present illness. Patient was evaluated in the context of the global COVID-19 pandemic, which necessitated consideration that the patient might be at risk for infection with the SARS-CoV-2 virus that causes COVID-19. Institutional protocols and algorithms that pertain to the evaluation of patients at risk for COVID-19 are in a state of rapid change based on information released by regulatory bodies including the CDC and federal and state organizations. These policies and algorithms were followed during the patient's care in the ED.  Some ED evaluations and interventions may be delayed as a result of limited staffing during the pandemic.   [place] Controlled Substance Database was reviewed by me. ____________________________________________   FINAL CLINICAL  IMPRESSION(S) / ED DIAGNOSES   Final diagnoses:  Gastroenteritis      NEW MEDICATIONS STARTED DURING THIS VISIT:  ED Discharge Orders         Ordered    ondansetron (ZOFRAN ODT) 4 MG disintegrating tablet  Every 8 hours PRN        03/12/20 0707           Note:  This document was prepared using Dragon voice recognition software and may include unintentional dictation errors.    Don Perking, [place], MD 03/12/20 [id number]

## 2020-03-12 NOTE — ED Notes (Signed)
Patient transported to Ultrasound 

## 2020-03-12 NOTE — ED Notes (Signed)
Patient transported to CT 

## 2020-03-12 NOTE — ED Notes (Signed)
Patient in stretcher, resting. Patient reported abdominal pain is 1/10 when laying still. Patient states "I feel better after the medicine and vomiting".

## 2020-03-12 NOTE — ED Triage Notes (Signed)
Pt reports has been dealing with Diarrhea since Tuesday, and abdominal pain. Pt reports today he felt like he was going to pass out, Pt actively vomiting in triage.

## 2020-05-23 ENCOUNTER — Other Ambulatory Visit: Payer: Self-pay | Admitting: Orthopedic Surgery

## 2020-05-23 DIAGNOSIS — S83422A Sprain of lateral collateral ligament of left knee, initial encounter: Secondary | ICD-10-CM

## 2020-05-23 DIAGNOSIS — M2392 Unspecified internal derangement of left knee: Secondary | ICD-10-CM

## 2020-05-23 DIAGNOSIS — M7652 Patellar tendinitis, left knee: Secondary | ICD-10-CM

## 2020-05-24 ENCOUNTER — Other Ambulatory Visit: Payer: Self-pay

## 2020-05-24 ENCOUNTER — Ambulatory Visit
Admission: RE | Admit: 2020-05-24 | Discharge: 2020-05-24 | Disposition: A | Discharge status: Home / Self Care | Payer: Managed Care, Other (non HMO) | Source: Ambulatory Visit | Attending: Orthopedic Surgery | Admitting: Orthopedic Surgery

## 2020-05-24 DIAGNOSIS — S83422A Sprain of lateral collateral ligament of left knee, initial encounter: Secondary | ICD-10-CM | POA: Insufficient documentation

## 2020-05-24 DIAGNOSIS — M7652 Patellar tendinitis, left knee: Secondary | ICD-10-CM | POA: Diagnosis present

## 2020-05-24 DIAGNOSIS — M2392 Unspecified internal derangement of left knee: Secondary | ICD-10-CM | POA: Insufficient documentation

## 2021-07-15 ENCOUNTER — Emergency Department: Payer: Managed Care, Other (non HMO)

## 2021-07-15 ENCOUNTER — Other Ambulatory Visit: Payer: Self-pay

## 2021-07-15 ENCOUNTER — Encounter: Payer: Self-pay | Admitting: Emergency Medicine

## 2021-07-15 ENCOUNTER — Emergency Department
Admission: EM | Admit: 2021-07-15 | Discharge: 2021-07-15 | Disposition: A | Discharge status: Home / Self Care | Payer: Managed Care, Other (non HMO) | Attending: Emergency Medicine | Admitting: Emergency Medicine

## 2021-07-15 DIAGNOSIS — R140 Abdominal distension (gaseous): Secondary | ICD-10-CM | POA: Diagnosis not present

## 2021-07-15 DIAGNOSIS — Z7982 Long term (current) use of aspirin: Secondary | ICD-10-CM | POA: Insufficient documentation

## 2021-07-15 DIAGNOSIS — R1011 Right upper quadrant pain: Secondary | ICD-10-CM

## 2021-07-15 DIAGNOSIS — R0789 Other chest pain: Secondary | ICD-10-CM | POA: Diagnosis not present

## 2021-07-15 DIAGNOSIS — R0602 Shortness of breath: Secondary | ICD-10-CM | POA: Insufficient documentation

## 2021-07-15 DIAGNOSIS — R7309 Other abnormal glucose: Secondary | ICD-10-CM | POA: Insufficient documentation

## 2021-07-15 LAB — CBC
HCT: 40.8 % (ref 39.0–52.0)
Hemoglobin: 13.1 g/dL (ref 13.0–17.0)
MCH: 29.6 pg (ref 26.0–34.0)
MCHC: 32.1 g/dL (ref 30.0–36.0)
MCV: 92.1 fL (ref 80.0–100.0)
Platelets: 297 10*3/uL (ref 150–400)
RBC: 4.43 MIL/uL (ref 4.22–5.81)
RDW: 13.2 % (ref 11.5–15.5)
WBC: 8.8 10*3/uL (ref 4.0–10.5)
nRBC: 0 % (ref 0.0–0.2)

## 2021-07-15 LAB — BASIC METABOLIC PANEL
Anion gap: 8 (ref 5–15)
BUN: 17 mg/dL (ref 6–20)
CO2: 24 mmol/L (ref 22–32)
Calcium: 9 mg/dL (ref 8.9–10.3)
Chloride: 104 mmol/L (ref 98–111)
Creatinine, Ser: 0.92 mg/dL (ref 0.61–1.24)
GFR, Estimated: >60 mL/min (ref >60)
Glucose, Bld: 119 mg/dL — ABNORMAL HIGH (ref 70–99)
Potassium: 3.7 mmol/L (ref 3.5–5.1)
Sodium: 136 mmol/L (ref 135–145)

## 2021-07-15 LAB — HEPATIC FUNCTION PANEL
ALT: 22 U/L (ref 0–44)
AST: 20 U/L (ref 15–41)
Albumin: 3.9 g/dL (ref 3.5–5.0)
Alkaline Phosphatase: 57 U/L (ref 38–126)
Bilirubin, Direct: <0.1 mg/dL (ref 0.0–0.2)
Total Bilirubin: 0.5 mg/dL (ref 0.3–1.2)
Total Protein: 7.1 g/dL (ref 6.5–8.1)

## 2021-07-15 LAB — TROPONIN I (HIGH SENSITIVITY)
Troponin I (High Sensitivity): 3 ng/L (ref <18)
Troponin I (High Sensitivity): <2 ng/L (ref <18)

## 2021-07-15 LAB — LIPASE, BLOOD: Lipase: 35 U/L (ref 11–51)

## 2021-07-15 LAB — CBG MONITORING, ED: Glucose-Capillary: 106 mg/dL — ABNORMAL HIGH (ref 70–99)

## 2021-07-15 MED ORDER — FENTANYL CITRATE PF 50 MCG/ML IJ SOSY
50.0000 ug | PREFILLED_SYRINGE | Freq: Once | INTRAMUSCULAR | Status: AC
  Administered 2021-07-15: 50 ug via INTRAVENOUS
  Filled 2021-07-15: qty 1

## 2021-07-15 MED ORDER — ALBUTEROL SULFATE (2.5 MG/3ML) 0.083% IN NEBU
2.5000 mg | INHALATION_SOLUTION | Freq: Once | RESPIRATORY_TRACT | Status: AC
  Administered 2021-07-15: 2.5 mg via RESPIRATORY_TRACT
  Filled 2021-07-15: qty 3

## 2021-07-15 MED ORDER — FAMOTIDINE IN NACL 20-0.9 MG/50ML-% IV SOLN
20.0000 mg | Freq: Once | INTRAVENOUS | Status: AC
  Administered 2021-07-15: 20 mg via INTRAVENOUS
  Filled 2021-07-15: qty 50

## 2021-07-15 MED ORDER — ONDANSETRON HCL 4 MG/2ML IJ SOLN
4.0000 mg | Freq: Once | INTRAMUSCULAR | Status: AC
  Administered 2021-07-15: 4 mg via INTRAVENOUS
  Filled 2021-07-15: qty 2

## 2021-07-15 MED ORDER — ALBUTEROL SULFATE HFA 108 (90 BASE) MCG/ACT IN AERS: 2.0000 | INHALATION_SPRAY | RESPIRATORY_TRACT | 0 refills | Status: AC | PRN

## 2021-07-15 NOTE — ED Notes (Signed)
Pt beating himself on the chest. Spouse at side.  ?

## 2021-07-15 NOTE — ED Triage Notes (Signed)
Pt reports feeling short of breath for about 2hrs. Pt reports he is not able to sleep as he feels something sitting on his chest, hard to catch and deep breath. Pt denies any respiratory problems. Pt speaks in complete sentences.  ?

## 2021-07-15 NOTE — ED Notes (Signed)
Pt reports he feels like he is going to pass out, rechecked vitals signs, cbg. Pt reports he ate pizza and tried a new beer. Took some TUMS felt like he had indigestion. Pt talks in complete sentences. Reports shortness of breath comes in waves.  ?

## 2021-07-15 NOTE — ED Provider Notes (Signed)
? ?[facility] ?Provider Note ? ? ? Event Date/Time  ? First MD Initiated Contact with Patient 07/15/21 [phone]   ?  (approximate) ? ? ?History  ? ?Shortness of Breath ? ? ?HPI ? ?Anthony Crawford is a 41 y.o. male who presents to the ED from home with a chief complaint of shortness of breath.  Patient states he started feeling short of breath while getting ready for bed and has not been to sleep tonight.  Reports central chest pressure, abdominal pain and bloating and gassiness.  Ate pizza for dinner.  Denies associated fever, cough, diaphoresis, nausea, vomiting or diarrhea.  Denies recent travel, trauma or hormone use. ?  ? ? ?Past Medical History  ? ?Past Medical History:  ?Diagnosis Date  ?? Anxiety   ?? Depression   ?? Stroke [facility])   ? ? ? ?Active Problem List  ? ?Patient Active Problem List  ? Diagnosis Date Noted  ?? Bipolar affective disorder (HCC) 09/14/2018  ?? TIA (transient ischemic attack) 09/13/2018  ?? Cerebrovascular accident (CVA) (HCC)   ? ? ? ?Past Surgical History  ?No past surgical history on file. ? ? ?Home Medications  ? ?Prior to Admission medications   ?Medication Sig Start Date End Date Taking? Authorizing Provider  ?aspirin EC 81 MG tablet Take 1 tablet (81 mg total) by mouth daily. 09/14/18   Jimmye Norman, NP  ?atorvastatin (LIPITOR) 20 MG tablet Take 1 tablet (20 mg total) by mouth daily. 09/14/18 09/14/19  Jimmye Norman, NP  ?ibuprofen (ADVIL,MOTRIN) 200 MG tablet Take 200 mg by mouth every 6 (six) hours as needed. Pain     [provider]  ?lamoTRIgine (LAMICTAL) 100 MG tablet Take 1 tablet (100 mg total) by mouth daily. Patient to start on July 22.  Dose of 25 mg started on 6/21 with titration to 100 mg to start on 7/22 as he awaits to see his psychiatrist. 09/15/18   Charm Rings, NP  ?lamoTRIgine (LAMICTAL) 25 MG tablet Take 1 tablet (25 mg total) by mouth daily. 09/15/18   Charm Rings, NP  ?ondansetron (ZOFRAN ODT) 4 MG disintegrating  tablet Take 1 tablet (4 mg total) by mouth every 8 (eight) hours as needed. 03/12/20   Nita Sickle, MD  ? ? ? ?Allergies  ?Other, Strawberry extract, and Tequila sunrise flavor ? ? ?Family History  ? ?Family History  ?Problem Relation Age of Onset  ?? Lupus Mother   ?? Diabetes Father   ? ? ? ?Physical Exam  ?Triage Vital Signs: ?ED Triage Vitals  ?Enc Vitals Group  ?   BP 07/15/21 0145 110/83  ?   Pulse Rate 07/15/21 0145 85  ?   Resp 07/15/21 0145 18  ?   Temp 07/15/21 0145 97.8 ?F (36.6 ?C)  ?   Temp Source 07/15/21 0145 Oral  ?   SpO2 07/15/21 0145 99 %  ?   Weight 07/15/21 0145 235 lb (106.6 kg)  ?   Height 07/15/21 0145 5\' 11"  (1.803 m)  ?   Head Circumference --   ?   Peak Flow --   ?   Pain Score 07/15/21 0145 0  ?   Pain Loc --   ?   Pain Edu? --   ?   Excl. in GC? --   ? ? ?Updated Vital Signs: ?BP 114/69   Pulse 84   Temp 97.8 ?F (36.6 ?C) (Oral)   Resp 20   Ht 5\' 11"  (1.803  m)   Wt 106.6 kg   SpO2 99%   BMI 32.78 kg/m?  ? ? ?General: Awake, no distress.  Prefers to lay supine. ?CV:  RRR.  Good peripheral perfusion.  ?Resp:  Normal effort.  Mildly diminished bibasilarly but otherwise CTA B. ?Abd:  Mildly tender to palpation right upper quadrant without rebound or guarding.  No CVAT.  Mild distention.  Belching. ?Other:  No vesicles.  Calves are nontender and nonswollen. ? ? ?ED Results / Procedures / Treatments  ?Labs ?(all labs ordered are listed, but only abnormal results are displayed) ?Labs Reviewed  ?BASIC METABOLIC PANEL - Abnormal; Notable for the following components:  ?    Result Value  ? Glucose, Bld 119 (*)   ? All other components within normal limits  ?CBG MONITORING, ED - Abnormal; Notable for the following components:  ? Glucose-Capillary 106 (*)   ? All other components within normal limits  ?CBC  ?HEPATIC FUNCTION PANEL  ?LIPASE, BLOOD  ?TROPONIN I (HIGH SENSITIVITY)  ?TROPONIN I (HIGH SENSITIVITY)  ? ? ? ?EKG ? ?ED ECG REPORT ?I, Irean HongSUNG,Akon Reinoso J, the attending physician,  personally viewed and interpreted this ECG. ? ? Date: 07/15/2021 ? EKG Time: 0145 ? Rate: 83 ? Rhythm: normal sinus rhythm ? Axis: Normal ? Intervals:none ? ST&T Change: Nonspecific ? ? ? ?RADIOLOGY ?I have independently visualized and interpreted patient's chest x-ray and ultrasound as well as noted the radiology interpretation: ? ?Chest x-ray: No acute cardiopulmonary process ? ?Ultrasound: ? ?Official radiology report(s): ?DG Chest 2 View ? ?Result Date: 07/15/2021 ?CLINICAL DATA:  Shortness of breath EXAM: CHEST - 2 VIEW COMPARISON:  None. FINDINGS: The heart size and mediastinal contours are within normal limits. Both lungs are clear. The visualized skeletal structures are unremarkable. IMPRESSION: No active cardiopulmonary disease. Electronically Signed   By: Deatra RobinsonKevin  Herman M.D.   On: 07/15/2021 02:05  ? ?US ABDOMEN LIMITED RUQ (LIVER/GB) ? ?Result Date: 07/15/2021 ?CLINICAL DATA:  Epigastric pain. EXAM: ULTRASOUND ABDOMEN LIMITED RIGHT UPPER QUADRANT COMPARISON:  CT with IV contrast 03/12/2020. FINDINGS: Gallbladder: No gallstones or wall thickening visualized. No sonographic Murphy sign noted by sonographer. Gallbladder is mostly contracted. Common bile duct: Diameter: 4.1 mm with no appreciable intrahepatic biliary dilatation. Liver: No focal lesion identified. There is diffuse increased echogenicity consistent steatosis. Portal vein is patent on color Doppler imaging with normal direction of blood flow towards the liver. Other: None. Difficult study with limited image quality due to: Patient body habitus with attenuation of the ultrasound beam, and bowel gas shadowing. IMPRESSION: Limited exam. Gallbladder is contracted but grossly unremarkable. There is no positive elicited right upper abdominal tenderness. Liver is echogenic consistent with fatty replacement. Electronically Signed   By: Almira BarKeith  Chesser M.D.   On: 07/15/2021 05:06   ? ? ?PROCEDURES: ? ?Critical Care performed: No ? ?.1-3 Lead EKG  Interpretation ?Performed by: Irean HongSung, Lizvet Chunn J, MD ?Authorized by: Irean HongSung, Deidrick Rainey J, MD  ? ?  Interpretation: normal   ?  ECG rate:  85 ?  ECG rate assessment: normal   ?  Rhythm: sinus rhythm   ?  Ectopy: none   ?  Conduction: normal   ?Comments:  ?   Patient placed on cardiac monitor to evaluate for arrhythmias ? ? ?MEDICATIONS ORDERED IN ED: ?Medications  ?albuterol (PROVENTIL) (2.5 MG/3ML) 0.083% nebulizer solution 2.5 mg (2.5 mg Nebulization Given 07/15/21 0343)  ?famotidine (PEPCID) IVPB 20 mg premix (0 mg Intravenous Stopped 07/15/21 0412)  ?fentaNYL (SUBLIMAZE) injection 50 mcg (50  mcg Intravenous Given 07/15/21 0335)  ?ondansetron [facility]) injection 4 mg (4 mg Intravenous Given 07/15/21 0335)  ? ? ? ?IMPRESSION / MDM / ASSESSMENT AND PLAN / ED COURSE  ?I reviewed the triage vital signs and the nursing notes. ?             ?               ?41 year old male presenting with shortness of breath and abdominal pain. Differential includes, but is not limited to, viral syndrome, bronchitis including COPD exacerbation, pneumonia, reactive airway disease including asthma, CHF including exacerbation with or without pulmonary/interstitial edema, pneumothorax, ACS, thoracic trauma, and pulmonary embolism.  I have personally reviewed patient's records and see that he had a recent psychiatry visit 07/04/2021 for bipolar disorder.  He had a PCP office visit on 09/14/2020 for sleep disorder; tells me he has not done a sleep study yet. ? ?The patient is on the cardiac monitor to evaluate for evidence of arrhythmia and/or significant heart rate changes. ? ?Laboratory results thus far demonstrate normal WBC 8.8, normal BMP, initial troponin negative.  Will add LFTs/lipase and repeat troponin.  Chest x-ray is unremarkable.  Will obtain right upper quadrant abdominal ultrasound for pain and bloating.  Administer albuterol nebulizer for diminished aeration, IV fentanyl for pain paired with IV Zofran for to prevent nausea and IV Pepcid.  Will  reassess. ? ?Clinical Course as of 07/15/21 0518  ?Sat Jul 15, 2021  ?[id number] Patient feeling much better after nebulizer treatment. Aeration improved. Updated patient and spouse on negative repeat troponin and ultrasound. Will disch

## 2021-07-15 NOTE — ED Notes (Signed)
Lab called to add on hepatic function panel and lipase, states will add it on.  ?

## 2021-07-15 NOTE — Discharge Instructions (Signed)
You may use Albuterol inhaler 2 puffs every 4 hours as needed for breathing difficulty.  Return to the ER for worsening symptoms, persistent vomiting, difficulty breathing or other concerns. 

## 2021-07-20 ENCOUNTER — Other Ambulatory Visit: Payer: Self-pay

## 2021-07-20 ENCOUNTER — Emergency Department
Admission: EM | Admit: 2021-07-20 | Discharge: 2021-07-20 | Disposition: A | Discharge status: Home / Self Care | Payer: Managed Care, Other (non HMO) | Attending: Emergency Medicine | Admitting: Emergency Medicine

## 2021-07-20 ENCOUNTER — Encounter: Payer: Self-pay | Admitting: Emergency Medicine

## 2021-07-20 ENCOUNTER — Emergency Department: Payer: Managed Care, Other (non HMO)

## 2021-07-20 DIAGNOSIS — R0602 Shortness of breath: Secondary | ICD-10-CM | POA: Diagnosis present

## 2021-07-20 DIAGNOSIS — R0789 Other chest pain: Secondary | ICD-10-CM | POA: Insufficient documentation

## 2021-07-20 DIAGNOSIS — Z20822 Contact with and (suspected) exposure to covid-19: Secondary | ICD-10-CM | POA: Diagnosis not present

## 2021-07-20 DIAGNOSIS — R0600 Dyspnea, unspecified: Secondary | ICD-10-CM

## 2021-07-20 LAB — CBC WITH DIFFERENTIAL/PLATELET
Abs Immature Granulocytes: 0.06 10*3/uL (ref 0.00–0.07)
Basophils Absolute: 0 10*3/uL (ref 0.0–0.1)
Basophils Relative: 0 %
Eosinophils Absolute: 0 10*3/uL (ref 0.0–0.5)
Eosinophils Relative: 0 %
HCT: 40.8 % (ref 39.0–52.0)
Hemoglobin: 13.5 g/dL (ref 13.0–17.0)
Immature Granulocytes: 1 %
Lymphocytes Relative: 16 %
Lymphs Abs: 1.7 10*3/uL (ref 0.7–4.0)
MCH: 30.1 pg (ref 26.0–34.0)
MCHC: 33.1 g/dL (ref 30.0–36.0)
MCV: 91.1 fL (ref 80.0–100.0)
Monocytes Absolute: 0.7 10*3/uL (ref 0.1–1.0)
Monocytes Relative: 7 %
Neutro Abs: 8 10*3/uL — ABNORMAL HIGH (ref 1.7–7.7)
Neutrophils Relative %: 76 %
Platelets: 329 10*3/uL (ref 150–400)
RBC: 4.48 MIL/uL (ref 4.22–5.81)
RDW: 13.3 % (ref 11.5–15.5)
WBC: 10.5 10*3/uL (ref 4.0–10.5)
nRBC: 0 % (ref 0.0–0.2)

## 2021-07-20 LAB — COMPREHENSIVE METABOLIC PANEL
ALT: 22 U/L (ref 0–44)
AST: 23 U/L (ref 15–41)
Albumin: 4.2 g/dL (ref 3.5–5.0)
Alkaline Phosphatase: 64 U/L (ref 38–126)
Anion gap: 7 (ref 5–15)
BUN: 14 mg/dL (ref 6–20)
CO2: 25 mmol/L (ref 22–32)
Calcium: 9.8 mg/dL (ref 8.9–10.3)
Chloride: 105 mmol/L (ref 98–111)
Creatinine, Ser: 0.89 mg/dL (ref 0.61–1.24)
GFR, Estimated: >60 mL/min (ref >60)
Glucose, Bld: 120 mg/dL — ABNORMAL HIGH (ref 70–99)
Potassium: 4 mmol/L (ref 3.5–5.1)
Sodium: 137 mmol/L (ref 135–145)
Total Bilirubin: 0.5 mg/dL (ref 0.3–1.2)
Total Protein: 7.6 g/dL (ref 6.5–8.1)

## 2021-07-20 LAB — D-DIMER, QUANTITATIVE: D-Dimer, Quant: <0.27 ug/mL-FEU (ref 0.00–0.50)

## 2021-07-20 LAB — TROPONIN I (HIGH SENSITIVITY): Troponin I (High Sensitivity): <2 ng/L (ref <18)

## 2021-07-20 LAB — RESP PANEL BY RT-PCR (FLU A&B, COVID) ARPGX2
Influenza A by PCR: NEGATIVE
Influenza B by PCR: NEGATIVE
SARS Coronavirus 2 by RT PCR: NEGATIVE

## 2021-07-20 LAB — LIPASE, BLOOD: Lipase: 34 U/L (ref 11–51)

## 2021-07-20 LAB — BRAIN NATRIURETIC PEPTIDE: B Natriuretic Peptide: 4.6 pg/mL (ref 0.0–100.0)

## 2021-07-20 NOTE — Discharge Instructions (Signed)
As we discussed, your work-up was reassuring in the emergency department tonight.  We did not identify a specific cause of your symptoms.  It is possible that anxiety could be playing a role.  We recommend you continue taking your antacid medication daily as well as any other medications prescribed to you, and follow-up with your primary care provider as scheduled. ? ?  Return to the emergency department if you develop new or worsening symptoms that concern you. ?

## 2021-07-20 NOTE — ED Provider Notes (Signed)
? ?[facility] ?Provider Note ? ? ? Event Date/Time  ? First MD Initiated Contact with Patient 07/20/21 [phone]   ?  (approximate) ? ? ?History  ? ?Shortness of Breath ? ? ?HPI ? ?Anthony Crawford is a 41 y.o. male whose past medical history includes bipolar affective disorder and prior TIA.  He presents for evaluation of persistent but episodic shortness of breath and occasional chest pain.  He was seen in this emergency department about 5 days ago for the same symptoms.  He had a thorough evaluation and his symptoms improved after treatment for acid reflux and a nebulizer treatment.  In the absence of any acute or emergent finding, he was discharged home.   ? ?He said that he did feel better for a while and he did not take any more PPI for several days after his dose on Sunday because he was feeling better.  He then started having episodes of either burning or heaviness in his chest, occasional sharp pain, and feeling like he could not get any air.  He states that the position in which he is holding himself now, straight up with his head tilted slightly back, is the most comfortable position and he is finally able to breathe. ? ?He has no swelling, no sore throat, no nausea or vomiting, no lower abdominal pain.  Occasional upper abdominal pain that he acknowledges could be acid reflux.  He also feels "gassy".  He has no prior history of cardiac disease or blood clots in the legs of the lungs. ?  ? ? ?Physical Exam  ? ?Triage Vital Signs: ?ED Triage Vitals  ?Enc Vitals Group  ?   BP 07/20/21 0404 109/77  ?   Pulse Rate 07/20/21 0404 83  ?   Resp 07/20/21 0404 16  ?   Temp 07/20/21 0404 97.9 ?F (36.6 ?C)  ?   Temp Source 07/20/21 0404 Oral  ?   SpO2 07/20/21 0404 98 %  ?   Weight 07/20/21 0402 106.6 kg (235 lb)  ?   Height 07/20/21 0402 1.803 m (5\' 11" )  ?   Head Circumference --   ?   Peak Flow --   ?   Pain Score 07/20/21 0401 6  ?   Pain Loc --   ?   Pain Edu? --   ?   Excl. in GC? --   ? ? ?Most recent  vital signs: ?Vitals:  ? 07/20/21 0530 07/20/21 0630  ?BP: 97/64 104/75  ?Pulse: 75 77  ?Resp: 12 16  ?Temp:    ?SpO2: 91% 94%  ? ? ? ?General: Awake, no distress.  ?CV:  Good peripheral perfusion.  Normal heart sounds. ?Resp:  Normal effort.  Lungs are clear to auscultation bilaterally with no wheezes or coarse breath sounds.  No accessory muscle usage. ?Abd:  No distention.  No tenderness to palpation of the abdomen including the epigastrium and right upper quadrant. ?Other:  Patient has a fairly flat affect.  He seems fairly sensitive to sensory stimuli, including reporting pain with the blood pressure cuff and the tourniquet used to start the IV, great deal of discomfort with IV start, and complains of the taste in his mouth when a saline flush was utilized. ? ? ?ED Results / Procedures / Treatments  ? ?Labs ?(all labs ordered are listed, but only abnormal results are displayed) ?Labs Reviewed  ?COMPREHENSIVE METABOLIC PANEL - Abnormal; Notable for the following components:  ?    Result Value  ?  Glucose, Bld 120 (*)   ? All other components within normal limits  ?CBC WITH DIFFERENTIAL/PLATELET - Abnormal; Notable for the following components:  ? Neutro Abs 8.0 (*)   ? All other components within normal limits  ?RESP PANEL BY RT-PCR (FLU A&B, COVID) [id number]  ?D-DIMER, QUANTITATIVE  ?LIPASE, BLOOD  ?BRAIN NATRIURETIC PEPTIDE  ?TROPONIN I (HIGH SENSITIVITY)  ? ? ? ?EKG ? ?ED ECG REPORT ?ILoleta Rose, Jeremey Bascom, the attending physician, personally viewed and interpreted this ECG. ? ?Date: 07/20/2021 ?EKG Time: 4:03 AM ?Rate: 86 ?Rhythm: normal sinus rhythm ?QRS Axis: normal ?Intervals: normal ?ST/T Wave abnormalities: Non-specific ST segment / T-wave changes, but no clear evidence of acute ischemia. ?Narrative Interpretation: no definitive evidence of acute ischemia; does not meet STEMI criteria. ? ? ? ?RADIOLOGY ?No acute abnormalities identified on two-view chest x-ray including no evidence of pulmonary edema or  infection. ? ? ? ?PROCEDURES: ? ?Critical Care performed: No ? ?.1-3 Lead EKG Interpretation ?Performed by: Loleta RoseForbach, Schylar Allard, MD ?Authorized by: Loleta RoseForbach, Robynn Marcel, MD  ? ?  Interpretation: normal   ?  ECG rate:  90 ?  ECG rate assessment: normal   ?  Rhythm: sinus rhythm   ?  Ectopy: none   ?  Conduction: normal   ? ? ?MEDICATIONS ORDERED IN ED: ?Medications - No data to display ? ? ?IMPRESSION / MDM / ASSESSMENT AND PLAN / ED COURSE  ?I reviewed the triage vital signs and the nursing notes. ?             ?               ? ?Differential diagnosis includes, but is not limited to, anxiety, panic attack, acute exacerbation of chronic bipolar disorder, acid reflux, pancreatitis, biliary colic, less likely ACS or AAS or PE. ? ?Wells score for PE is 0 and patient is PERC negative.  He is low risk for PE and I will further restratify with a D-dimer given his persistent symptoms.  His vital signs are stable and within normal limits other than some initial tachypnea.  I personally reviewed his two-view chest x-ray and there is no evidence of acute infection or other acute abnormality.  His EKG is reassuring with no evidence of ischemia. ? ?I ordered the following labs: Respiratory viral panel, D-dimer, high-sensitivity troponin, CBC with differential, lipase, CMP, BNP. ? ?I explained to the patient that I think it is very likely that he is suffering from anxiety and panic attacks as well as some persistent acid reflux.  I reviewed his visit from 5 days ago and he had a reassuring and normal right upper quadrant ultrasound.  There is no indication to repeat it, particularly in the absence of any abdominal tenderness. ? ?I explained that we would repeat the lab work, that his chest x-ray is normal, and that we would reassess, but most likely this will be a matter of outpatient follow-up. ? ?The patient is on the cardiac monitor to evaluate for evidence of arrhythmia and/or significant heart rate changes. ? ?Clinical Course as of  07/20/21 0646  ?Thu Jul 20, 2021  ?0614 I reviewed all the patient's lab results.  BNP, high-sensitivity troponin, D-dimer, CBC with differential, lipase, and comprehensive metabolic panel are all within normal limits.  Respiratory viral panel is still pending. [CF]  ?[id number] Resp Panel by RT-PCR (Flu A&B, Covid) Nasopharyngeal Swab ?Negative respiratory viral panel [CF]  ?[id number] Patient is resting comfortably in a flat, right lateral decubitus position.  No respiratory  distress is evident. ? ?I updated him and his wife regarding all of the reassuring findings and acknowledge that I cannot explain exactly why he is having his symptoms.  I again suggested that there may be an element of anxiety, acid reflux, and a combination of the 2 that is leading to his symptoms.  He and his wife understand and agree with the plan for discharge and outpatient follow-up and his wife commented that the patient has an appointment in about 5 days in [place] with his primary care provider.  I encouraged them to keep that appointment and I gave my usual and customary return precautions. ? [CF]  ?  ?Clinical Course User Index ?[CF] Loleta Rose, MD  ? ? ? ?FINAL CLINICAL IMPRESSION(S) / ED DIAGNOSES  ? ?Final diagnoses:  ?Dyspnea, unspecified type  ? ? ? ?Rx / DC Orders  ? ?ED Discharge Orders   ? ? None  ? ?  ? ? ? ?Note:  This document was prepared using Dragon voice recognition software and may include unintentional dictation errors. ?  ?Loleta Rose, MD ?07/20/21 [phone] ? ?

## 2021-07-20 NOTE — ED Triage Notes (Signed)
Patient ambulatory to triage with steady gait, without difficulty or distress noted; reports seen Saturday for [facility] and midsternal CP with negative findings but symptoms persist ?

## 2022-07-12 ENCOUNTER — Emergency Department: Payer: Managed Care, Other (non HMO)

## 2022-07-12 ENCOUNTER — Emergency Department
Admission: EM | Admit: 2022-07-12 | Discharge: 2022-07-12 | Disposition: A | Discharge status: Home / Self Care | Payer: Managed Care, Other (non HMO) | Attending: Emergency Medicine | Admitting: Emergency Medicine

## 2022-07-12 ENCOUNTER — Other Ambulatory Visit: Payer: Self-pay

## 2022-07-12 DIAGNOSIS — R42 Dizziness and giddiness: Secondary | ICD-10-CM | POA: Diagnosis present

## 2022-07-12 DIAGNOSIS — T675XXA Heat exhaustion, unspecified, initial encounter: Secondary | ICD-10-CM | POA: Diagnosis not present

## 2022-07-12 DIAGNOSIS — E860 Dehydration: Secondary | ICD-10-CM | POA: Diagnosis not present

## 2022-07-12 DIAGNOSIS — T679XXA Effect of heat and light, unspecified, initial encounter: Secondary | ICD-10-CM

## 2022-07-12 LAB — URINALYSIS, ROUTINE W REFLEX MICROSCOPIC
Bilirubin Urine: NEGATIVE
Glucose, UA: NEGATIVE mg/dL
Hgb urine dipstick: NEGATIVE
Ketones, ur: 5 mg/dL — AB
Leukocytes,Ua: NEGATIVE
Nitrite: NEGATIVE
Protein, ur: NEGATIVE mg/dL
Specific Gravity, Urine: 1.003 — ABNORMAL LOW (ref 1.005–1.030)
pH: 7 (ref 5.0–8.0)

## 2022-07-12 LAB — BASIC METABOLIC PANEL
Anion gap: 10 (ref 5–15)
BUN: 17 mg/dL (ref 6–20)
CO2: 23 mmol/L (ref 22–32)
Calcium: 9.5 mg/dL (ref 8.9–10.3)
Chloride: 101 mmol/L (ref 98–111)
Creatinine, Ser: 1 mg/dL (ref 0.61–1.24)
GFR, Estimated: >60 mL/min (ref >60)
Glucose, Bld: 100 mg/dL — ABNORMAL HIGH (ref 70–99)
Potassium: 3.5 mmol/L (ref 3.5–5.1)
Sodium: 134 mmol/L — ABNORMAL LOW (ref 135–145)

## 2022-07-12 LAB — CBC
HCT: 42.5 % (ref 39.0–52.0)
Hemoglobin: 14.2 g/dL (ref 13.0–17.0)
MCH: 30.1 pg (ref 26.0–34.0)
MCHC: 33.4 g/dL (ref 30.0–36.0)
MCV: 90 fL (ref 80.0–100.0)
Platelets: 295 10*3/uL (ref 150–400)
RBC: 4.72 MIL/uL (ref 4.22–5.81)
RDW: 13.4 % (ref 11.5–15.5)
WBC: 10.3 10*3/uL (ref 4.0–10.5)
nRBC: 0 % (ref 0.0–0.2)

## 2022-07-12 LAB — TROPONIN I (HIGH SENSITIVITY): Troponin I (High Sensitivity): <2 ng/L (ref <18)

## 2022-07-12 LAB — CK: Total CK: 103 U/L (ref 49–397)

## 2022-07-12 MED ORDER — SODIUM CHLORIDE 0.9 % IV BOLUS
1000.0000 mL | Freq: Once | INTRAVENOUS | Status: AC
  Administered 2022-07-12: 1000 mL via INTRAVENOUS

## 2022-07-12 NOTE — ED Triage Notes (Addendum)
Pt comes with c/o possible heat exhaustion. Pt was outside and got overheated. Pt states he is now dizzy and he feels weak. Pt states some cp and sob now too. Pt states he feels he might pass out.

## 2022-07-12 NOTE — ED Provider Notes (Signed)
[facility] Provider Note    Event Date/Time   First MD Initiated Contact with Patient 07/12/22 1825     (approximate)   History   Heat Exposure   HPI  Anthony Crawford is a 42 y.o. male who presents to the emergency department with concern for dizziness and confusion.  Patient states that he thinks he might have heat exhaustion.  He had been outside for a number of hours this afternoon.  States he has not been drinking that much.  Did have an episode of vomiting but continued to work.  He then developed dizziness.  Went inside and tried to cool down.  Started drinking water.  However continues to feel dizzy. Additionally feels like his cognition is off.     Physical Exam   Triage Vital Signs: ED Triage Vitals  Enc Vitals Group     BP 07/12/22 1819 106/74     Pulse Rate 07/12/22 1819 92     Resp 07/12/22 1819 (!) 21     Temp 07/12/22 1819 97.7 F (36.5 C)     Temp Source 07/12/22 1819 Oral     SpO2 07/12/22 1819 99 %     Weight --      Height --      Head Circumference --      Peak Flow --      Pain Score 07/12/22 1813 7     Pain Loc --      Pain Edu? --      Excl. in GC? --     Most recent vital signs: Vitals:   07/12/22 1819  BP: 106/74  Pulse: 92  Resp: (!) 21  Temp: 97.7 F (36.5 C)  SpO2: 99%   General: Awake, alert, oriented. CV:  Good peripheral perfusion. Regular rate and rhythm. Resp:  Normal effort. Lungs clear. Abd:  No distention.     ED Results / Procedures / Treatments   Labs (all labs ordered are listed, but only abnormal results are displayed) Labs Reviewed  BASIC METABOLIC PANEL - Abnormal; Notable for the following components:      Result Value   Sodium 134 (*)    Glucose, Bld 100 (*)    All other components within normal limits  URINALYSIS, ROUTINE W REFLEX MICROSCOPIC - Abnormal; Notable for the following components:   Color, Urine STRAW (*)    APPearance CLEAR (*)    Specific Gravity, Urine 1.003 (*)     Ketones, ur 5 (*)    All other components within normal limits  CBC  CK  TROPONIN I (HIGH SENSITIVITY)  TROPONIN I (HIGH SENSITIVITY)     EKG  I, Phineas Semen, attending physician, personally viewed and interpreted this EKG  EKG Time: 1819 Rate: 92 Rhythm: normal sinus rhythm Axis: normal Intervals: qtc 440 QRS: narrow ST changes: no st elevation Impression: normal ekg   RADIOLOGY I independently interpreted and visualized the CXR. My interpretation: lungs clear Radiology interpretation:  IMPRESSION:  No acute cardiopulmonary disease     PROCEDURES:  Critical Care performed: No    MEDICATIONS ORDERED IN ED: Medications  sodium chloride 0.9 % bolus 1,000 mL (has no administration in time range)     IMPRESSION / MDM / ASSESSMENT AND PLAN / ED COURSE  I reviewed the triage vital signs and the nursing notes.  Differential diagnosis includes, but is not limited to, dehydration, rhabdomyolysis, heat exposure  Patient's presentation is most consistent with acute presentation with potential threat to life or bodily function.   The patient is on the cardiac monitor to evaluate for evidence of arrhythmia and/or significant heart rate changes.  Patient presented to the emergency department today with concerns for heat exhaustion and dehydration.  Initial exam patient was awake and alert.  No significant hypotension.  Blood work here without elevated CK level.  Troponin negative.  UA showed 5 ketones.  Patient was given IV fluids.  After IV fluids patient did feel significant improvement.  Given patient's improvement and lack of concerning findings and blood work do think is reasonable for patient be discharged.  Encouraged hydration.      FINAL CLINICAL IMPRESSION(S) / ED DIAGNOSES   Final diagnoses:  Heat exposure, initial encounter  Dehydration     Note:  This document was prepared using Dragon voice recognition software and  may include unintentional dictation errors.    Phineas Semen, MD 07/12/22 2055

## 2022-07-12 NOTE — Discharge Instructions (Signed)
Please seek medical attention for any high fevers, chest pain, shortness of breath, change in behavior, persistent vomiting, bloody stool or any other new or concerning symptoms.  

## 2023-08-13 IMAGING — CR DG CHEST 2V
2 series · 2 of 2 positions shown · non-contrast
Comparison: Chest x-ray 07/15/2021.

CLINICAL DATA: 40-year-old male with history of persistent
shortness of breath.

EXAM:
CHEST - 2 VIEW

[chest pa]
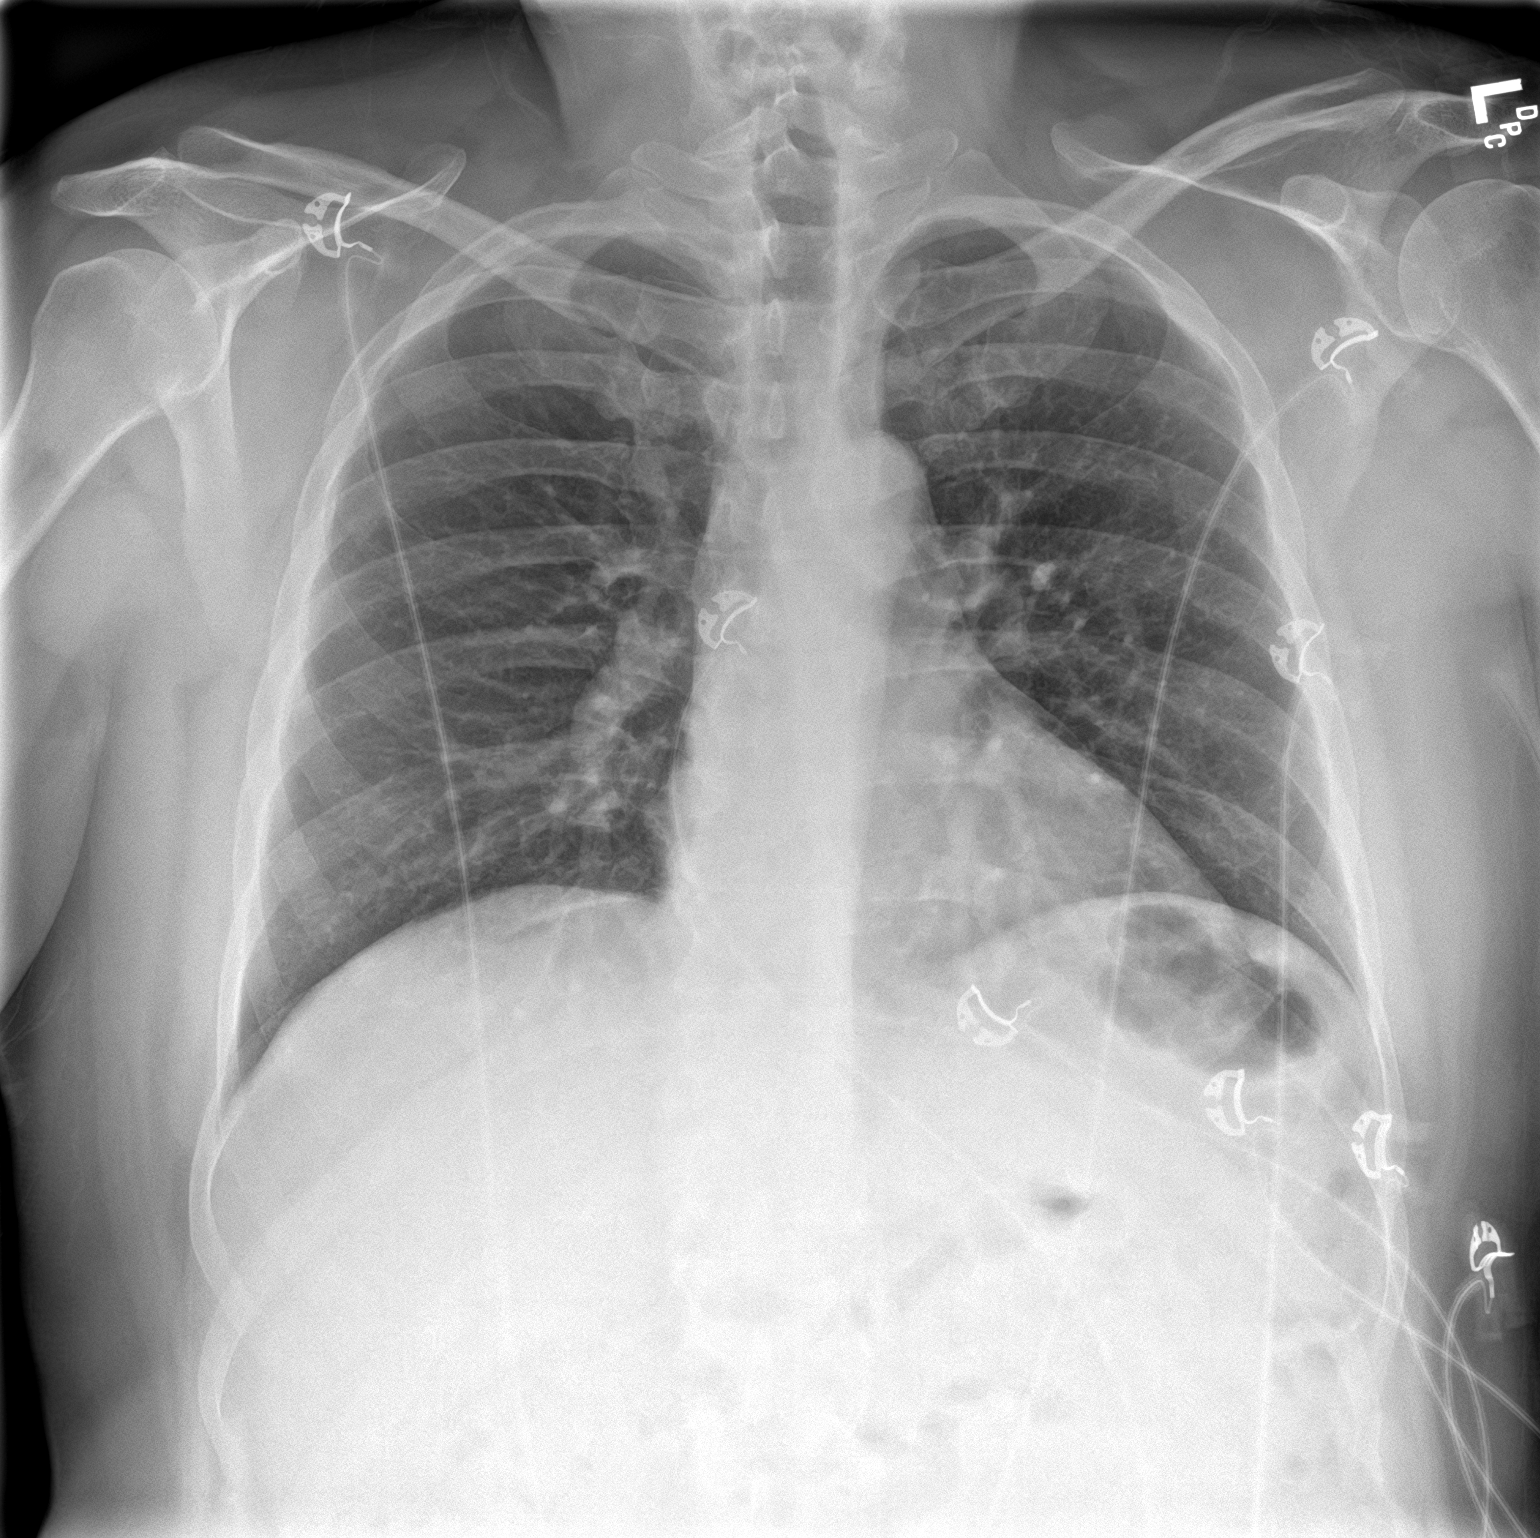

[chest lat]
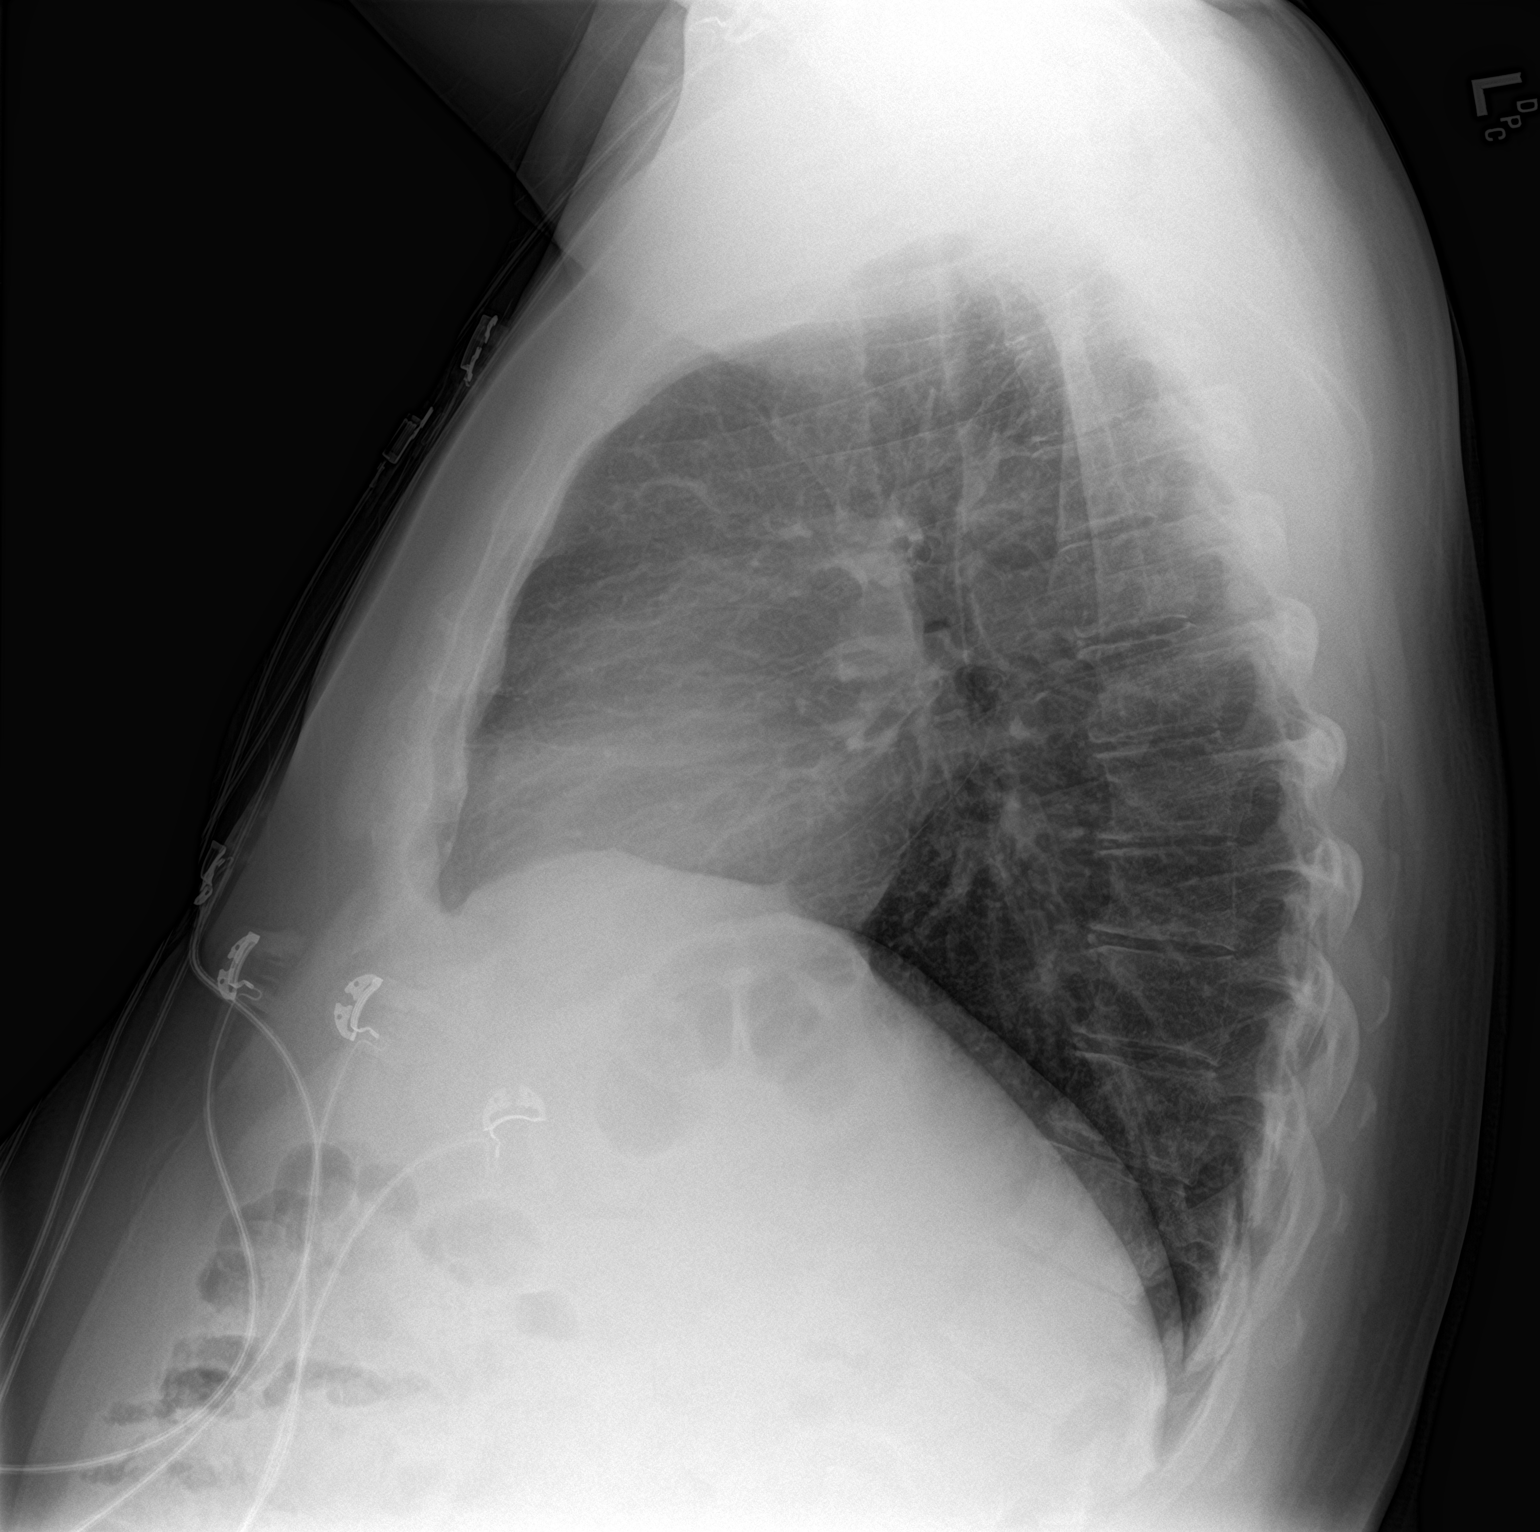

[2 of 2 positions shown; findings below may reference images not displayed]

FINDINGS: Lung volumes are normal. No consolidative airspace disease. No
pleural effusions. No pneumothorax. No pulmonary nodule or mass
noted. Pulmonary vasculature and the cardiomediastinal silhouette
are within normal limits.
IMPRESSION: No radiographic evidence of acute cardiopulmonary disease.
